# Patient Record
Sex: Female | Born: 1935
Health system: Southern US, Community
[De-identification: ages and names within clinical notes are randomized; demographics above are authoritative.]

## PROBLEM LIST (undated history)

## (undated) DIAGNOSIS — M1990 Unspecified osteoarthritis, unspecified site: Secondary | ICD-10-CM

## (undated) DIAGNOSIS — E785 Hyperlipidemia, unspecified: Secondary | ICD-10-CM

## (undated) DIAGNOSIS — D649 Anemia, unspecified: Secondary | ICD-10-CM

## (undated) DIAGNOSIS — T7840XA Allergy, unspecified, initial encounter: Secondary | ICD-10-CM

## (undated) DIAGNOSIS — I10 Essential (primary) hypertension: Secondary | ICD-10-CM

## (undated) HISTORY — DX: Anemia, unspecified: D64.9

## (undated) HISTORY — DX: Hyperlipidemia, unspecified: E78.5

## (undated) HISTORY — PX: ABDOMINAL HYSTERECTOMY: SHX81

## (undated) HISTORY — DX: Essential (primary) hypertension: I10

## (undated) HISTORY — DX: Unspecified osteoarthritis, unspecified site: M19.90

## (undated) HISTORY — DX: Allergy, unspecified, initial encounter: T78.40XA

---

## 1993-09-15 DIAGNOSIS — I10 Essential (primary) hypertension: Secondary | ICD-10-CM | POA: Insufficient documentation

## 2010-12-16 DIAGNOSIS — R42 Dizziness and giddiness: Secondary | ICD-10-CM | POA: Insufficient documentation

## 2010-12-16 DIAGNOSIS — E782 Mixed hyperlipidemia: Secondary | ICD-10-CM | POA: Insufficient documentation

## 2010-12-16 DIAGNOSIS — E559 Vitamin D deficiency, unspecified: Secondary | ICD-10-CM | POA: Insufficient documentation

## 2010-12-16 DIAGNOSIS — R7301 Impaired fasting glucose: Secondary | ICD-10-CM | POA: Insufficient documentation

## 2014-06-09 DIAGNOSIS — I10 Essential (primary) hypertension: Secondary | ICD-10-CM | POA: Diagnosis not present

## 2014-10-12 ENCOUNTER — Encounter: Payer: Self-pay | Admitting: Family Medicine

## 2014-10-12 ENCOUNTER — Ambulatory Visit (INDEPENDENT_AMBULATORY_CARE_PROVIDER_SITE_OTHER): Payer: Commercial Managed Care - HMO | Admitting: Family Medicine

## 2014-10-12 VITALS — BP 118/70 | HR 62 | Temp 98.2°F | Resp 18 | Ht 66.0 in | Wt 164.6 lb

## 2014-10-12 DIAGNOSIS — R32 Unspecified urinary incontinence: Secondary | ICD-10-CM | POA: Insufficient documentation

## 2014-10-12 DIAGNOSIS — I10 Essential (primary) hypertension: Secondary | ICD-10-CM | POA: Diagnosis not present

## 2014-10-12 DIAGNOSIS — M21161 Varus deformity, not elsewhere classified, right knee: Secondary | ICD-10-CM

## 2014-10-12 DIAGNOSIS — R42 Dizziness and giddiness: Secondary | ICD-10-CM | POA: Insufficient documentation

## 2014-10-12 DIAGNOSIS — E785 Hyperlipidemia, unspecified: Secondary | ICD-10-CM | POA: Diagnosis not present

## 2014-10-12 DIAGNOSIS — M25561 Pain in right knee: Secondary | ICD-10-CM | POA: Diagnosis not present

## 2014-10-12 DIAGNOSIS — M21169 Varus deformity, not elsewhere classified, unspecified knee: Secondary | ICD-10-CM | POA: Insufficient documentation

## 2014-10-12 DIAGNOSIS — M1990 Unspecified osteoarthritis, unspecified site: Secondary | ICD-10-CM | POA: Insufficient documentation

## 2014-10-12 DIAGNOSIS — D649 Anemia, unspecified: Secondary | ICD-10-CM | POA: Insufficient documentation

## 2014-10-12 MED ORDER — BENAZEPRIL HCL 20 MG PO TABS: 20.0000 mg | ORAL_TABLET | Freq: Every day | ORAL | Status: DC

## 2014-10-12 MED ORDER — AMLODIPINE BESYLATE 10 MG PO TABS: 10.0000 mg | ORAL_TABLET | Freq: Every day | ORAL | Status: DC

## 2014-10-12 NOTE — Progress Notes (Signed)
Name: Michelle Blackwell   MRN: [id number]    DOB: Jan 27, 1936   Date:10/12/2014       Progress Note  Subjective  Chief Complaint  Chief Complaint  Patient presents with  . Medication Refill  . Hypertension  . Hyperlipidemia  . Anemia  . Arthritis    HPI  Hypertension: Patient here for follow-up of elevated blood pressure. She is exercising and is adherent to low salt diet. Currently taking herbal supplements, amlodipine 10 mg a day and lotensin 20 mg a day. Blood pressure is well controlled at home. Cardiac symptoms none. Patient denies chest pain, chest pressure/discomfort, claudication, dyspnea, exertional chest pressure/discomfort, irregular heart beat, lower extremity edema, near-syncope, orthopnea, palpitations, paroxysmal nocturnal dyspnea and syncope.  Cardiovascular risk factors: advanced age (older than 53 for men, 22 for women), dyslipidemia and hypertension. Use of agents associated with hypertension: none. History of target organ damage: none.  Joint/Muscle Pain: Patient complains of arthralgias for which has been present for several months. Pain is located in the right knee(s), is described as aching, and is intermittent .  Associated symptoms include: edema and instability.  The patient has tried nothing for pain relief.  Related to injury:   No.  Resting usually helps. But she is very active and when working at [employer] she notes the knee pain is more prominent. She is not interested in NSAIDs at this time but would like orthopedic evaluation to discuss if a knee brace would be beneficial to her to prevent further deterioration. She has had extroverted feet for which she uses custom orthotics.     Patient Active Problem List   Diagnosis Date Noted  . Absolute anemia 10/12/2014  . Arthritis 10/12/2014  . HLD (hyperlipidemia) 10/12/2014  . BP (high blood pressure) 10/12/2014  . Head revolving around 10/12/2014  . Absence of bladder continence 10/12/2014    Social History   Substance Use Topics  . Smoking status: Never Smoker   . Smokeless tobacco: Never Used  . Alcohol Use: 0.0 oz/week    0 Standard drinks or equivalent per week     Comment: occasional      Current outpatient prescriptions:  .  amLODipine (NORVASC) 10 MG tablet, , Disp: , Rfl:  .  benazepril (LOTENSIN) 20 MG tablet, , Disp: , Rfl:  .  Biotin 1000 MCG tablet, Take by mouth., Disp: , Rfl:  .  Cinnamon 500 MG capsule, Take by mouth., Disp: , Rfl:  .  Cyanocobalamin (VITAMIN B-12 ER PO), , Disp: , Rfl:  .  Fenugreek 610 MG CAPS, Take by mouth., Disp: , Rfl:  .  GINSENG PO, Take by mouth., Disp: , Rfl:  .  Guggulipid 500 MG CAPS, Take by mouth., Disp: , Rfl:  .  Gymnema Sylvestris Leaf POWD, , Disp: , Rfl:  .  HAWTHORN PO, Take by mouth., Disp: , Rfl:  .  LECITHIN PO, , Disp: , Rfl:  .  Multiple Vitamins-Minerals (MULTIVITAMIN ADULTS 50+) TABS, Take by mouth., Disp: , Rfl:  .  Multiple Vitamins-Minerals (VISION VITAMINS PO), , Disp: , Rfl:  .  Omega-3 Fatty Acids (FISH OIL PO), , Disp: , Rfl:  .  TURMERIC PO, , Disp: , Rfl:  .  vitamin E 400 UNIT capsule, Take by mouth., Disp: , Rfl:   Past Surgical History  Procedure Laterality Date  . Abdominal hysterectomy      History reviewed. No pertinent family history.  Allergies  Allergen Reactions  . Ciprofloxacin  green beans - croup, gas & bloating  fresh mango - throat closure & facial swelling     Review of Systems  CONSTITUTIONAL: No significant weight changes, fever, chills, weakness or fatigue.  HEENT:  - Eyes: No visual changes.  - Ears: No auditory changes. No pain.  - Nose: No sneezing, congestion, runny nose. - Throat: No sore throat. No changes in swallowing. SKIN: No rash or itching.  CARDIOVASCULAR: No chest pain, chest pressure or chest discomfort. No palpitations or edema.  RESPIRATORY: No shortness of breath, cough or sputum.  GASTROINTESTINAL: No anorexia, nausea, vomiting. No changes in bowel habits. No  abdominal pain or blood.  GENITOURINARY: No dysuria. No frequency. No discharge.  NEUROLOGICAL: No headache, dizziness, syncope, paralysis, ataxia, numbness or tingling in the extremities. No memory changes. No change in bowel or bladder control.  MUSCULOSKELETAL: Yes joint pain. No muscle pain. HEMATOLOGIC: No anemia, bleeding or bruising.  LYMPHATICS: No enlarged lymph nodes.  PSYCHIATRIC: No change in mood. No change in sleep pattern.  ENDOCRINOLOGIC: No reports of sweating, cold or heat intolerance. No polyuria or polydipsia.     Objective  BP 118/70 mmHg  Pulse 62  Temp(Src) 98.2 F (36.8 C) (Oral)  Resp 18  Ht 5\' 6"  (1.676 m)  Wt 164 lb 9.6 oz (74.662 kg)  BMI 26.58 kg/m2  SpO2 97% Body mass index is 26.58 kg/(m^2).  Physical Exam  Constitutional: Patient appears well-developed and well-nourished. In no distress.  HEENT:  - Head: Normocephalic and atraumatic.  - Ears: Bilateral TMs gray, no erythema or effusion - Nose: Nasal mucosa moist - Mouth/Throat: Oropharynx is clear and moist. No tonsillar hypertrophy or erythema. No post nasal drainage.  - Eyes: Conjunctivae clear, EOM movements normal. PERRLA. No scleral icterus.  Neck: Normal range of motion. Neck supple. No JVD present. No thyromegaly present.  Cardiovascular: Normal rate, regular rhythm and normal heart sounds.  No murmur heard.  Pulmonary/Chest: Effort normal and breath sounds normal. No respiratory distress. Musculoskeletal: Normal range of motion bilateral UE and LE, no joint effusions. Right knee genu varus with mild swelling, no laxity, full ROM, mild crepitus.  Peripheral vascular: Bilateral LE no edema. Neurological: CN II-XII grossly intact with no focal deficits. Alert and oriented to person, place, and time. Coordination, balance, strength, speech and gait are normal.  Skin: Skin is warm and dry. No rash noted. No erythema.  Psychiatric: Patient has a normal mood and affect. Behavior is normal in  office today. Judgment and thought content normal in office today.   Assessment & Plan   1. Hypertension goal BP (blood pressure) < 140/90 Clinically stable findings based on clinical exam and on review of any pertinent results. Recommended to patient that they continue their current regimen with regular follow ups.  - CBC with Differential/Platelet - Comprehensive metabolic panel - TSH - amLODipine (NORVASC) 10 MG tablet; Take 1 tablet (10 mg total) by mouth daily.  Dispense: 90 tablet; Refill: 3 - benazepril (LOTENSIN) 20 MG tablet; Take 1 tablet (20 mg total) by mouth daily.  Dispense: 90 tablet; Refill: 3  2. Hyperlipidemia LDL goal <100 Blood work.  - Lipid panel - TSH  3. Right knee pain Discussed treatment options, she would prefer to NSAIDs at this time but will consider using ibuprofen prior to activity if needed.  - Ambulatory referral to Orthopedic Surgery  4. Genu varus, right  - Ambulatory referral to Orthopedic Surgery   She prefers to get her flu shot at [facility].

## 2014-10-13 LAB — LIPID PANEL
Chol/HDL Ratio: 6.7 ratio units — ABNORMAL HIGH (ref 0.0–4.4)
Cholesterol, Total: 233 mg/dL — ABNORMAL HIGH (ref 100–199)
HDL: 35 mg/dL — ABNORMAL LOW (ref >39)
LDL Calculated: 156 mg/dL — ABNORMAL HIGH (ref 0–99)
Triglycerides: 208 mg/dL — ABNORMAL HIGH (ref 0–149)
VLDL Cholesterol Cal: 42 mg/dL — ABNORMAL HIGH (ref 5–40)

## 2014-10-13 LAB — COMPREHENSIVE METABOLIC PANEL
ALT: 22 IU/L (ref 0–32)
AST: 22 IU/L (ref 0–40)
Albumin/Globulin Ratio: 1.6 (ref 1.1–2.5)
Albumin: 4.6 g/dL (ref 3.5–4.8)
Alkaline Phosphatase: 107 IU/L (ref 39–117)
BUN/Creatinine Ratio: 16 (ref 11–26)
BUN: 15 mg/dL (ref 8–27)
Bilirubin Total: 0.3 mg/dL (ref 0.0–1.2)
CO2: 26 mmol/L (ref 18–29)
Calcium: 10.3 mg/dL (ref 8.7–10.3)
Chloride: 102 mmol/L (ref 97–108)
Creatinine, Ser: 0.95 mg/dL (ref 0.57–1.00)
GFR calc Af Amer: 67 mL/min/{1.73_m2} (ref >59)
GFR calc non Af Amer: 58 mL/min/{1.73_m2} — ABNORMAL LOW (ref >59)
Globulin, Total: 2.9 g/dL (ref 1.5–4.5)
Glucose: 91 mg/dL (ref 65–99)
Potassium: 5.3 mmol/L — ABNORMAL HIGH (ref 3.5–5.2)
Sodium: 145 mmol/L — ABNORMAL HIGH (ref 134–144)
Total Protein: 7.5 g/dL (ref 6.0–8.5)

## 2014-10-13 LAB — CBC WITH DIFFERENTIAL/PLATELET
Basophils Absolute: 0.1 10*3/uL (ref 0.0–0.2)
Basos: 1 %
EOS (ABSOLUTE): 0.3 10*3/uL (ref 0.0–0.4)
Eos: 5 %
Hematocrit: 42.6 % (ref 34.0–46.6)
Hemoglobin: 14.1 g/dL (ref 11.1–15.9)
Immature Grans (Abs): 0 10*3/uL (ref 0.0–0.1)
Immature Granulocytes: 0 %
Lymphocytes Absolute: 1.6 10*3/uL (ref 0.7–3.1)
Lymphs: 26 %
MCH: 29.6 pg (ref 26.6–33.0)
MCHC: 33.1 g/dL (ref 31.5–35.7)
MCV: 90 fL (ref 79–97)
Monocytes Absolute: 0.6 10*3/uL (ref 0.1–0.9)
Monocytes: 9 %
Neutrophils Absolute: 3.5 10*3/uL (ref 1.4–7.0)
Neutrophils: 59 %
Platelets: 345 10*3/uL (ref 150–379)
RBC: 4.76 x10E6/uL (ref 3.77–5.28)
RDW: 13.6 % (ref 12.3–15.4)
WBC: 6 10*3/uL (ref 3.4–10.8)

## 2014-10-13 LAB — TSH: TSH: 3.14 u[IU]/mL (ref 0.450–4.500)

## 2014-10-19 DIAGNOSIS — M170 Bilateral primary osteoarthritis of knee: Secondary | ICD-10-CM | POA: Diagnosis not present

## 2015-01-12 DIAGNOSIS — H2513 Age-related nuclear cataract, bilateral: Secondary | ICD-10-CM | POA: Diagnosis not present

## 2015-01-12 DIAGNOSIS — H40013 Open angle with borderline findings, low risk, bilateral: Secondary | ICD-10-CM | POA: Diagnosis not present

## 2015-01-12 DIAGNOSIS — Z0100 Encounter for examination of eyes and vision without abnormal findings: Secondary | ICD-10-CM | POA: Diagnosis not present

## 2015-07-27 ENCOUNTER — Other Ambulatory Visit: Payer: Self-pay

## 2015-07-27 DIAGNOSIS — I10 Essential (primary) hypertension: Secondary | ICD-10-CM

## 2015-07-27 MED ORDER — AMLODIPINE BESYLATE 10 MG PO TABS
10.0000 mg | ORAL_TABLET | Freq: Every day | ORAL | Status: DC
Start: 2015-07-27 — End: 2015-10-31

## 2015-07-27 MED ORDER — BENAZEPRIL HCL 20 MG PO TABS: 20.0000 mg | ORAL_TABLET | Freq: Every day | ORAL | Status: DC

## 2015-07-27 NOTE — Telephone Encounter (Signed)
Pt will schedule an appt at a later date.

## 2015-07-27 NOTE — Telephone Encounter (Signed)
Please ask pt to schedule an appt and we'll get fasting labs too; I'd like to recheck her potassium before I approve a 90 day supply; we might need to change her medicine; thank you

## 2015-10-27 ENCOUNTER — Ambulatory Visit: Payer: Commercial Managed Care - HMO | Admitting: Family Medicine

## 2015-10-31 ENCOUNTER — Ambulatory Visit (INDEPENDENT_AMBULATORY_CARE_PROVIDER_SITE_OTHER): Payer: Commercial Managed Care - HMO | Admitting: Family Medicine

## 2015-10-31 ENCOUNTER — Other Ambulatory Visit: Payer: Self-pay | Admitting: Family Medicine

## 2015-10-31 ENCOUNTER — Encounter: Payer: Self-pay | Admitting: Family Medicine

## 2015-10-31 VITALS — BP 136/74 | HR 64 | Temp 97.5°F | Resp 14 | Wt 167.0 lb

## 2015-10-31 DIAGNOSIS — E785 Hyperlipidemia, unspecified: Secondary | ICD-10-CM

## 2015-10-31 DIAGNOSIS — I10 Essential (primary) hypertension: Secondary | ICD-10-CM | POA: Diagnosis not present

## 2015-10-31 DIAGNOSIS — Z5181 Encounter for therapeutic drug level monitoring: Secondary | ICD-10-CM

## 2015-10-31 DIAGNOSIS — Z23 Encounter for immunization: Secondary | ICD-10-CM

## 2015-10-31 DIAGNOSIS — E875 Hyperkalemia: Secondary | ICD-10-CM

## 2015-10-31 DIAGNOSIS — M1990 Unspecified osteoarthritis, unspecified site: Secondary | ICD-10-CM

## 2015-10-31 LAB — COMPLETE METABOLIC PANEL WITH GFR
ALT: 18 U/L (ref 6–29)
AST: 19 U/L (ref 10–35)
Albumin: 4.5 g/dL (ref 3.6–5.1)
Alkaline Phosphatase: 79 U/L (ref 33–130)
BUN: 21 mg/dL (ref 7–25)
CO2: 26 mmol/L (ref 20–31)
Calcium: 9.8 mg/dL (ref 8.6–10.4)
Chloride: 104 mmol/L (ref 98–110)
Creat: 0.91 mg/dL (ref 0.60–0.93)
GFR, Est African American: 70 mL/min (ref >=60)
GFR, Est Non African American: 61 mL/min (ref >=60)
Glucose, Bld: 90 mg/dL (ref 65–99)
Potassium: 4.5 mmol/L (ref 3.5–5.3)
Sodium: 140 mmol/L (ref 135–146)
Total Bilirubin: 0.5 mg/dL (ref 0.2–1.2)
Total Protein: 7.4 g/dL (ref 6.1–8.1)

## 2015-10-31 LAB — LIPID PANEL
Cholesterol: 251 mg/dL — ABNORMAL HIGH (ref 125–200)
HDL: 32 mg/dL — ABNORMAL LOW (ref >=46)
LDL Cholesterol: 163 mg/dL — ABNORMAL HIGH (ref <130)
Total CHOL/HDL Ratio: 7.8 Ratio — ABNORMAL HIGH (ref <=5.0)
Triglycerides: 278 mg/dL — ABNORMAL HIGH (ref <150)
VLDL: 56 mg/dL — ABNORMAL HIGH (ref <30)

## 2015-10-31 MED ORDER — BENAZEPRIL HCL 20 MG PO TABS: 20.0000 mg | ORAL_TABLET | Freq: Every day | ORAL | 2 refills | Status: DC

## 2015-10-31 MED ORDER — AMLODIPINE BESYLATE 10 MG PO TABS
10.0000 mg | ORAL_TABLET | Freq: Every day | ORAL | 0 refills | Status: DC
Start: 2015-10-31 — End: 2016-01-04

## 2015-10-31 MED ORDER — BENAZEPRIL HCL 20 MG PO TABS: 20.0000 mg | ORAL_TABLET | Freq: Every day | ORAL | 0 refills | Status: DC

## 2015-10-31 NOTE — Assessment & Plan Note (Signed)
Mild hyperkalemia noted on last year's labs; she is on an ACE-I; check level today and adjust med and recheck if needed

## 2015-10-31 NOTE — Progress Notes (Signed)
BP 136/74   Pulse 64   Temp 97.5 F (36.4 C) (Oral)   Resp 14   Wt 167 lb (75.8 kg)   SpO2 95%   BMI 26.95 kg/m    Subjective:    Patient ID: Michelle Blackwell, female    DOB: 1935-05-15, 80 y.o.   MRN: [id number]  HPI: Michelle Blackwell is a 80 y.o. female  Chief Complaint  Patient presents with  . Medication Refill   Patient is new to me; her previous provider left our practice She is an Administrator She says the allergies are just green beans, mango, and cipro HTN; well-controlled currently; checks her BP away from the doctor; BP can go up and down; cannot do push mower any more, mowed for 20 minute and BP jumped 30 points, up to 152 after mowing; took hawthorne to help lower BP; her medicine doesn't last 24 hours; she avoids salt, goes out to eat occasionally; does not cook with salt; avoid decongestants High cholesterol; not on medicine; her cholesterol has not been controlled well she says; eating eggs all the time, removes the yolks sometimes; some cheese, loves cheese, low sodium chips for nachos Arthritis; hands, knees, hips, psoriatic arthritis (no skin lesions any more); no pitting on nails; no pain; takes hyaluronic acid; glucosamine does not work for her Anemia; last CBC Sept 2016 showed H/H was completely normal with normal MCV and normal MCH; anemia was >20 years ago she says around menopause  Depression screen [facility] 2/9 10/31/2015 10/12/2014  Decreased Interest 0 0  Down, Depressed, Hopeless 0 0  PHQ - 2 Score 0 0   Relevant past medical, surgical, family and social history reviewed Past Medical History:  Diagnosis Date  . Allergy   . Anemia   . Arthritis   . Hyperlipidemia   . Hypertension   no diabetes, no heart disease  Past Surgical History:  Procedure Laterality Date  . ABDOMINAL HYSTERECTOMY     Family History  Problem Relation Age of Onset  . Cancer Mother     lung cancer; heavy smoker for years  . Heart disease Mother     congestive heart failure  .  Heart disease Brother     not sure, may have had sudden cardiac death     Social History  Substance Use Topics  . Smoking status: Never Smoker  . Smokeless tobacco: Never Used  . Alcohol use 0.0 oz/week     Comment: occasional    Interim medical history since last visit reviewed. Allergies and medications reviewed  Review of Systems Per HPI unless specifically indicated above     Objective:    BP 136/74   Pulse 64   Temp 97.5 F (36.4 C) (Oral)   Resp 14   Wt 167 lb (75.8 kg)   SpO2 95%   BMI 26.95 kg/m   Wt Readings from Last 3 Encounters:  10/31/15 167 lb (75.8 kg)  10/12/14 164 lb 9.6 oz (74.7 kg)    Physical Exam  Constitutional: She appears well-developed and well-nourished. No distress.  HENT:  Head: Normocephalic and atraumatic.  Eyes: EOM are normal. No scleral icterus.  Neck: No thyromegaly present.  Cardiovascular: Normal rate, regular rhythm and normal heart sounds.   No murmur heard. Pulmonary/Chest: Effort normal and breath sounds normal. No respiratory distress. She has no wheezes.  Abdominal: Soft. Bowel sounds are normal. She exhibits no distension.  Musculoskeletal: Normal range of motion. She exhibits edema (trace nonpitting edema, trace line from sock  elastic).  heberdens and bouchards nodes both hands  Neurological: She is alert. She exhibits normal muscle tone.  Skin: Skin is warm and dry. She is not diaphoretic. No pallor.  Psychiatric: She has a normal mood and affect. Her behavior is normal. Judgment and thought content normal. Her mood appears not anxious. She does not exhibit a depressed mood.  Talkative but redirectable   Results for orders placed or performed in visit on 10/12/14  CBC with Differential/Platelet  Result Value Ref Range   WBC 6.0 3.4 - 10.8 x10E3/uL   RBC 4.76 3.77 - 5.28 x10E6/uL   Hemoglobin 14.1 11.1 - 15.9 g/dL   Hematocrit 42.6 34.0 - 46.6 %   MCV 90 79 - 97 fL   MCH 29.6 26.6 - 33.0 pg   MCHC 33.1 31.5 - 35.7  g/dL   RDW 13.6 12.3 - 15.4 %   Platelets 345 150 - 379 x10E3/uL   Neutrophils 59 %   Lymphs 26 %   Monocytes 9 %   Eos 5 %   Basos 1 %   Neutrophils Absolute 3.5 1.4 - 7.0 x10E3/uL   Lymphocytes Absolute 1.6 0.7 - 3.1 x10E3/uL   Monocytes Absolute 0.6 0.1 - 0.9 x10E3/uL   EOS (ABSOLUTE) 0.3 0.0 - 0.4 x10E3/uL   Basophils Absolute 0.1 0.0 - 0.2 x10E3/uL   Immature Granulocytes 0 %   Immature Grans (Abs) 0.0 0.0 - 0.1 x10E3/uL  Comprehensive metabolic panel  Result Value Ref Range   Glucose 91 65 - 99 mg/dL   BUN 15 8 - 27 mg/dL   Creatinine, Ser 0.95 0.57 - 1.00 mg/dL   GFR calc non Af Amer 58 (L) >59 mL/min/1.73   GFR calc Af Amer 67 >59 mL/min/1.73   BUN/Creatinine Ratio 16 11 - 26   Sodium 145 (H) 134 - 144 mmol/L   Potassium 5.3 (H) 3.5 - 5.2 mmol/L   Chloride 102 97 - 108 mmol/L   CO2 26 18 - 29 mmol/L   Calcium 10.3 8.7 - 10.3 mg/dL   Total Protein 7.5 6.0 - 8.5 g/dL   Albumin 4.6 3.5 - 4.8 g/dL   Globulin, Total 2.9 1.5 - 4.5 g/dL   Albumin/Globulin Ratio 1.6 1.1 - 2.5   Bilirubin Total 0.3 0.0 - 1.2 mg/dL   Alkaline Phosphatase 107 39 - 117 IU/L   AST 22 0 - 40 IU/L   ALT 22 0 - 32 IU/L  Lipid panel  Result Value Ref Range   Cholesterol, Total 233 (H) 100 - 199 mg/dL   Triglycerides 208 (H) 0 - 149 mg/dL   HDL 35 (L) >39 mg/dL   VLDL Cholesterol Cal 42 (H) 5 - 40 mg/dL   LDL Calculated 156 (H) 0 - 99 mg/dL   Chol/HDL Ratio 6.7 (H) 0.0 - 4.4 ratio units  TSH  Result Value Ref Range   TSH 3.140 0.450 - 4.500 uIU/mL      Assessment & Plan:   Problem List Items Addressed This Visit      Cardiovascular and Mediastinum   Hypertension goal BP (blood pressure) < 140/90 - Primary (Chronic)    Continue meds; DASH guidelines        Musculoskeletal and Integument   Arthritis (Chronic)    Continue herbal remedies        Other   Hyperlipidemia LDL goal <100 (Chronic)    Check lipids      Relevant Orders   Lipid panel   Hyperkalemia    Mild  hyperkalemia noted on last year's labs; she is on an ACE-I; check level today and adjust med and recheck if needed      Encounter for medication monitoring    Check labs      Relevant Orders   COMPLETE METABOLIC PANEL WITH GFR    Other Visit Diagnoses    Needs flu shot       Relevant Orders   Flu vaccine HIGH DOSE PF (Fluzone High dose) (Completed)      Follow up plan: Return in about 6 months (around 04/30/2016) for hypertension, high cholesterol.  An after-visit summary was printed and given to the patient at [facility].  Please see the patient instructions which may contain other information and recommendations beyond what is mentioned above in the assessment and plan.  No orders of the defined types were placed in this encounter.   Orders Placed This Encounter  Procedures  . Flu vaccine HIGH DOSE PF (Fluzone High dose)  . Lipid panel  . COMPLETE METABOLIC PANEL WITH GFR

## 2015-10-31 NOTE — Progress Notes (Signed)
90 d supply to pharmacy

## 2015-10-31 NOTE — Assessment & Plan Note (Signed)
Continue meds; DASH guidelines

## 2015-10-31 NOTE — Assessment & Plan Note (Signed)
Check labs 

## 2015-10-31 NOTE — Patient Instructions (Addendum)
Your goal blood pressure is less than 150 mmHg on top. Try to follow the DASH guidelines (DASH stands for Dietary Approaches to Stop Hypertension) Try to limit the sodium in your diet.  Ideally, consume less than 1.5 grams (less than 1,500mg ) per day. Do not add salt when cooking or at the table.  Check the sodium amount on labels when shopping, and choose items lower in sodium when given a choice. Avoid or limit foods that already contain a lot of sodium. Eat a diet rich in fruits and vegetables and whole grains.  Try to limit saturated fats in your diet (bologna, hot dogs, barbeque, cheeseburgers, hamburgers, steak, bacon, sausage, cheese, etc.) and get more fresh fruits, vegetables, and whole grains   DASH Eating Plan DASH stands for "Dietary Approaches to Stop Hypertension." The DASH eating plan is a healthy eating plan that has been shown to reduce high blood pressure (hypertension). Additional health benefits may include reducing the risk of type 2 diabetes mellitus, heart disease, and stroke. The DASH eating plan may also help with weight loss. WHAT DO I NEED TO KNOW ABOUT THE DASH EATING PLAN? For the DASH eating plan, you will follow these general guidelines:  Choose foods with a percent daily value for sodium of less than 5% (as listed on the food label).  Use salt-free seasonings or herbs instead of table salt or sea salt.  Check with your health care provider or pharmacist before using salt substitutes.  Eat lower-sodium products, often labeled as "lower sodium" or "no salt added."  Eat fresh foods.  Eat more vegetables, fruits, and low-fat dairy products.  Choose whole grains. Look for the word "whole" as the first word in the ingredient list.  Choose fish and skinless chicken or [place] more often than red meat. Limit fish, poultry, and meat to 6 oz (170 g) each day.  Limit sweets, desserts, sugars, and sugary drinks.  Choose heart-healthy fats.  Limit cheese to 1 oz (28  g) per day.  Eat more home-cooked food and less restaurant, buffet, and fast food.  Limit fried foods.  Cook foods using methods other than frying.  Limit canned vegetables. If you do use them, rinse them well to decrease the sodium.  When eating at a restaurant, ask that your food be prepared with less salt, or no salt if possible. WHAT FOODS CAN I EAT? Seek help from a dietitian for individual calorie needs. Grains Whole grain or whole wheat bread. Brown rice. Whole grain or whole wheat pasta. Quinoa, bulgur, and whole grain cereals. Low-sodium cereals. Corn or whole wheat flour tortillas. Whole grain cornbread. Whole grain crackers. Low-sodium crackers. Vegetables Fresh or frozen vegetables (raw, steamed, roasted, or grilled). Low-sodium or reduced-sodium tomato and vegetable juices. Low-sodium or reduced-sodium tomato sauce and paste. Low-sodium or reduced-sodium canned vegetables.  Fruits All fresh, canned (in natural juice), or frozen fruits. Meat and Other Protein Products Ground beef (85% or leaner), grass-fed beef, or beef trimmed of fat. Skinless chicken or [place]. Ground chicken or [place]. Pork trimmed of fat. All fish and seafood. Eggs. Dried beans, peas, or lentils. Unsalted nuts and seeds. Unsalted canned beans. Dairy Low-fat dairy products, such as skim or 1% milk, 2% or reduced-fat cheeses, low-fat ricotta or cottage cheese, or plain low-fat yogurt. Low-sodium or reduced-sodium cheeses. Fats and Oils Tub margarines without trans fats. Light or reduced-fat mayonnaise and salad dressings (reduced sodium). Avocado. Safflower, olive, or canola oils. Natural peanut or almond butter. Other Unsalted popcorn and pretzels.  The items listed above may not be a complete list of recommended foods or beverages. Contact your dietitian for more options. WHAT FOODS ARE NOT RECOMMENDED? Grains White bread. White pasta. White rice. Refined cornbread. Bagels and croissants. Crackers that  contain trans fat. Vegetables Creamed or fried vegetables. Vegetables in a cheese sauce. Regular canned vegetables. Regular canned tomato sauce and paste. Regular tomato and vegetable juices. Fruits Dried fruits. Canned fruit in light or heavy syrup. Fruit juice. Meat and Other Protein Products Fatty cuts of meat. Ribs, chicken wings, bacon, sausage, bologna, salami, chitterlings, fatback, hot dogs, bratwurst, and packaged luncheon meats. Salted nuts and seeds. Canned beans with salt. Dairy Whole or 2% milk, cream, half-and-half, and cream cheese. Whole-fat or sweetened yogurt. Full-fat cheeses or blue cheese. Nondairy creamers and whipped toppings. Processed cheese, cheese spreads, or cheese curds. Condiments Onion and garlic salt, seasoned salt, table salt, and sea salt. Canned and packaged gravies. Worcestershire sauce. Tartar sauce. Barbecue sauce. Teriyaki sauce. Soy sauce, including reduced sodium. Steak sauce. Fish sauce. Oyster sauce. Cocktail sauce. Horseradish. Ketchup and mustard. Meat flavorings and tenderizers. Bouillon cubes. Hot sauce. Tabasco sauce. Marinades. Taco seasonings. Relishes. Fats and Oils Butter, stick margarine, lard, shortening, ghee, and bacon fat. Coconut, palm kernel, or palm oils. Regular salad dressings. Other Pickles and olives. Salted popcorn and pretzels. The items listed above may not be a complete list of foods and beverages to avoid. Contact your dietitian for more information. WHERE CAN I FIND MORE INFORMATION? National Heart, Lung, and Blood Institute: travelstabloid.com   This information is not intended to replace advice given to you by your health care provider. Make sure you discuss any questions you have with your health care provider.   Document Released: 01/04/2011 Document Revised: 02/05/2014 Document Reviewed: 11/19/2012 Elsevier Interactive Patient Education [employer].

## 2015-10-31 NOTE — Assessment & Plan Note (Signed)
Continue herbal remedies

## 2015-10-31 NOTE — Assessment & Plan Note (Signed)
Check lipids 

## 2015-11-01 ENCOUNTER — Encounter: Payer: Self-pay | Admitting: Family Medicine

## 2016-01-04 ENCOUNTER — Other Ambulatory Visit: Payer: Self-pay | Admitting: Family Medicine

## 2016-01-04 DIAGNOSIS — I10 Essential (primary) hypertension: Secondary | ICD-10-CM

## 2016-01-05 DIAGNOSIS — H401130 Primary open-angle glaucoma, bilateral, stage unspecified: Secondary | ICD-10-CM | POA: Diagnosis not present

## 2016-01-05 DIAGNOSIS — H2513 Age-related nuclear cataract, bilateral: Secondary | ICD-10-CM | POA: Diagnosis not present

## 2016-01-06 DIAGNOSIS — Z0100 Encounter for examination of eyes and vision without abnormal findings: Secondary | ICD-10-CM | POA: Diagnosis not present

## 2016-01-07 NOTE — Telephone Encounter (Signed)
Last cmp reviewed; one year of refills of both rxs sent

## 2016-01-10 DIAGNOSIS — M545 Low back pain: Secondary | ICD-10-CM | POA: Diagnosis not present

## 2016-01-10 DIAGNOSIS — M170 Bilateral primary osteoarthritis of knee: Secondary | ICD-10-CM | POA: Diagnosis not present

## 2016-01-25 ENCOUNTER — Encounter: Payer: Self-pay | Admitting: Family Medicine

## 2016-02-20 DIAGNOSIS — M545 Low back pain: Secondary | ICD-10-CM | POA: Diagnosis not present

## 2016-02-20 DIAGNOSIS — M25561 Pain in right knee: Secondary | ICD-10-CM | POA: Diagnosis not present

## 2016-02-20 DIAGNOSIS — M25562 Pain in left knee: Secondary | ICD-10-CM | POA: Diagnosis not present

## 2016-02-24 DIAGNOSIS — M545 Low back pain: Secondary | ICD-10-CM | POA: Diagnosis not present

## 2016-02-24 DIAGNOSIS — M25562 Pain in left knee: Secondary | ICD-10-CM | POA: Diagnosis not present

## 2016-02-24 DIAGNOSIS — M25561 Pain in right knee: Secondary | ICD-10-CM | POA: Diagnosis not present

## 2016-02-29 DIAGNOSIS — M25562 Pain in left knee: Secondary | ICD-10-CM | POA: Diagnosis not present

## 2016-02-29 DIAGNOSIS — M25561 Pain in right knee: Secondary | ICD-10-CM | POA: Diagnosis not present

## 2016-02-29 DIAGNOSIS — M545 Low back pain: Secondary | ICD-10-CM | POA: Diagnosis not present

## 2016-03-02 DIAGNOSIS — M25562 Pain in left knee: Secondary | ICD-10-CM | POA: Diagnosis not present

## 2016-03-02 DIAGNOSIS — M25561 Pain in right knee: Secondary | ICD-10-CM | POA: Diagnosis not present

## 2016-03-02 DIAGNOSIS — M545 Low back pain: Secondary | ICD-10-CM | POA: Diagnosis not present

## 2016-03-07 DIAGNOSIS — M545 Low back pain: Secondary | ICD-10-CM | POA: Diagnosis not present

## 2016-03-07 DIAGNOSIS — M25562 Pain in left knee: Secondary | ICD-10-CM | POA: Diagnosis not present

## 2016-03-07 DIAGNOSIS — M25561 Pain in right knee: Secondary | ICD-10-CM | POA: Diagnosis not present

## 2016-03-23 DIAGNOSIS — M25561 Pain in right knee: Secondary | ICD-10-CM | POA: Diagnosis not present

## 2016-03-23 DIAGNOSIS — M545 Low back pain: Secondary | ICD-10-CM | POA: Diagnosis not present

## 2016-03-23 DIAGNOSIS — M25562 Pain in left knee: Secondary | ICD-10-CM | POA: Diagnosis not present

## 2016-03-26 ENCOUNTER — Other Ambulatory Visit: Payer: Self-pay

## 2016-03-26 DIAGNOSIS — E785 Hyperlipidemia, unspecified: Secondary | ICD-10-CM | POA: Diagnosis not present

## 2016-03-26 DIAGNOSIS — Z5181 Encounter for therapeutic drug level monitoring: Secondary | ICD-10-CM | POA: Diagnosis not present

## 2016-03-28 ENCOUNTER — Ambulatory Visit (INDEPENDENT_AMBULATORY_CARE_PROVIDER_SITE_OTHER): Payer: Medicare HMO | Admitting: Family Medicine

## 2016-03-28 ENCOUNTER — Encounter: Payer: Self-pay | Admitting: Family Medicine

## 2016-03-28 VITALS — BP 118/74 | HR 82 | Temp 97.8°F | Resp 14 | Wt 165.0 lb

## 2016-03-28 DIAGNOSIS — J0110 Acute frontal sinusitis, unspecified: Secondary | ICD-10-CM | POA: Diagnosis not present

## 2016-03-28 LAB — COMPLETE METABOLIC PANEL WITH GFR
ALT: 16 U/L (ref 6–29)
AST: 21 U/L (ref 10–35)
Albumin: 4.5 g/dL (ref 3.6–5.1)
Alkaline Phosphatase: 87 U/L (ref 33–130)
BUN: 20 mg/dL (ref 7–25)
CO2: 29 mmol/L (ref 20–31)
Calcium: 10 mg/dL (ref 8.6–10.4)
Chloride: 102 mmol/L (ref 98–110)
Creat: 0.96 mg/dL — ABNORMAL HIGH (ref 0.60–0.93)
GFR, Est African American: 66 mL/min (ref >=60)
GFR, Est Non African American: 57 mL/min — ABNORMAL LOW (ref >=60)
Glucose, Bld: 91 mg/dL (ref 65–99)
Potassium: 5.1 mmol/L (ref 3.5–5.3)
Sodium: 139 mmol/L (ref 135–146)
Total Bilirubin: 0.5 mg/dL (ref 0.2–1.2)
Total Protein: 8 g/dL (ref 6.1–8.1)

## 2016-03-28 LAB — LIPID PANEL
Cholesterol: 247 mg/dL — ABNORMAL HIGH (ref <200)
HDL: 30 mg/dL — ABNORMAL LOW (ref >50)
LDL Cholesterol: 153 mg/dL — ABNORMAL HIGH (ref <100)
Total CHOL/HDL Ratio: 8.2 Ratio — ABNORMAL HIGH (ref <5.0)
Triglycerides: 322 mg/dL — ABNORMAL HIGH (ref <150)
VLDL: 64 mg/dL — ABNORMAL HIGH (ref <30)

## 2016-03-28 MED ORDER — AMOXICILLIN-POT CLAVULANATE 875-125 MG PO TABS: 1.0000 | ORAL_TABLET | Freq: Two times a day (BID) | ORAL | 0 refills | Status: DC

## 2016-03-28 NOTE — Patient Instructions (Signed)
Rest and hydration You are welcome to wait another day or two to see if you get over this on your own without antibiotics If you start the antibiotic... Please do eat yogurt daily or take a probiotic daily for the next month or two We want to replace the healthy germs in the gut If you notice foul, watery diarrhea in the next two months, schedule an appointment RIGHT AWAY

## 2016-03-28 NOTE — Progress Notes (Signed)
BP 118/74   Pulse 82   Temp 97.8 F (36.6 C) (Oral)   Resp 14   Wt 165 lb (74.8 kg)   SpO2 94%   BMI 26.63 kg/m    Subjective:    Patient ID: Michelle Blackwell, female    DOB: April 11, 1935, 81 y.o.   MRN: [id number]  HPI: Michelle Blackwell is a 81 y.o. female  Chief Complaint  Patient presents with  . URI   Patient is here for an acute visit She started to feel symptoms on February 13th Her granddaughter had a 102 degree fever and stayed with her while she was sick; no aches and pains; coughing; then took her home and daughter had fever too; then visitors came in for family get-together Then patient started to get symptoms; took things to keep from getting sick No aches or pain No diarrhea or vomiting; strictly a head colde; still hoarse throat Thinks she has a sinus infection now; constant stuffiness and draining; sinus headache Makes feverfew salve and it helped the sinus headache Headaches over the forehead; one side will drain Green drainage Has had sinus infections before and this feels like it Has pressure in her face and cheek; also right lymph node under the jaw  Depression screen [facility] 2/9 03/28/2016 10/31/2015 10/12/2014  Decreased Interest 0 0 0  Down, Depressed, Hopeless 0 0 0  PHQ - 2 Score 0 0 0   Relevant past medical, surgical, family and social history reviewed Past Medical History:  Diagnosis Date  . Allergy   . Anemia   . Arthritis   . Hyperlipidemia   . Hypertension    Past Surgical History:  Procedure Laterality Date  . ABDOMINAL HYSTERECTOMY     Social History  Substance Use Topics  . Smoking status: Never Smoker  . Smokeless tobacco: Never Used  . Alcohol use 0.0 oz/week     Comment: occasional    Interim medical history since last visit reviewed. Allergies and medications reviewed  Review of Systems Per HPI unless specifically indicated above     Objective:    BP 118/74   Pulse 82   Temp 97.8 F (36.6 C) (Oral)   Resp 14   Wt 165 lb  (74.8 kg)   SpO2 94%   BMI 26.63 kg/m   Wt Readings from Last 3 Encounters:  03/28/16 165 lb (74.8 kg)  10/31/15 167 lb (75.8 kg)  10/12/14 164 lb 9.6 oz (74.7 kg)    Physical Exam  Constitutional: She appears well-developed and well-nourished. No distress.  HENT:  Right Ear: Hearing, tympanic membrane, external ear and ear canal normal. No drainage. Tympanic membrane is not erythematous. No middle ear effusion.  Left Ear: Hearing, tympanic membrane, external ear and ear canal normal. No drainage. Tympanic membrane is not erythematous.  No middle ear effusion.  Nose: Mucosal edema and rhinorrhea present.  Mouth/Throat: Oropharynx is clear and moist and mucous membranes are normal.  Eyes: EOM are normal. No scleral icterus.  Neck: No thyromegaly present.  Cardiovascular: Normal rate and regular rhythm.   Pulmonary/Chest: Effort normal and breath sounds normal.  Lymphadenopathy:    She has no cervical adenopathy.  Skin: No pallor.  Psychiatric: She has a normal mood and affect.      Assessment & Plan:   Problem List Items Addressed This Visit    None    Visit Diagnoses    Acute non-recurrent frontal sinusitis    -  Primary   suspect this followed URI;  sick now >10 days; offered ABX, but cautioned about C diff risk; symptomatic care   Relevant Medications   amoxicillin-clavulanate (AUGMENTIN) 875-125 MG tablet       Follow up plan: Return if symptoms worsen or fail to improve.  An after-visit summary was printed and given to the patient at [place].  Please see the patient instructions which may contain other information and recommendations beyond what is mentioned above in the assessment and plan.  Meds ordered this encounter  Medications  . Red Yeast Rice 500 MG/0.5GM POWD    Sig: Take by mouth.  . cholecalciferol (VITAMIN D) 1000 units tablet    Sig: Take 1,000 Units by mouth daily.  . Ascorbic Acid (VITAMIN C) 100 MG tablet    Sig: Take 100 mg by mouth daily.  Marland Kitchen  Hyaluronic Acid 20-60 MG CAPS    Sig: Take by mouth.  Marland Kitchen amoxicillin-clavulanate (AUGMENTIN) 875-125 MG tablet    Sig: Take 1 tablet by mouth 2 (two) times daily.    Dispense:  20 tablet    Refill:  0

## 2016-03-29 DIAGNOSIS — H40013 Open angle with borderline findings, low risk, bilateral: Secondary | ICD-10-CM | POA: Diagnosis not present

## 2016-03-29 DIAGNOSIS — D3132 Benign neoplasm of left choroid: Secondary | ICD-10-CM | POA: Diagnosis not present

## 2016-03-29 DIAGNOSIS — H2513 Age-related nuclear cataract, bilateral: Secondary | ICD-10-CM | POA: Diagnosis not present

## 2016-03-29 DIAGNOSIS — H25013 Cortical age-related cataract, bilateral: Secondary | ICD-10-CM | POA: Diagnosis not present

## 2016-03-29 DIAGNOSIS — H2512 Age-related nuclear cataract, left eye: Secondary | ICD-10-CM | POA: Diagnosis not present

## 2016-03-30 ENCOUNTER — Ambulatory Visit: Payer: Commercial Managed Care - HMO | Admitting: Family Medicine

## 2016-04-06 DIAGNOSIS — M25561 Pain in right knee: Secondary | ICD-10-CM | POA: Diagnosis not present

## 2016-04-06 DIAGNOSIS — M25562 Pain in left knee: Secondary | ICD-10-CM | POA: Diagnosis not present

## 2016-04-06 DIAGNOSIS — M545 Low back pain: Secondary | ICD-10-CM | POA: Diagnosis not present

## 2016-04-19 DIAGNOSIS — M545 Low back pain: Secondary | ICD-10-CM | POA: Diagnosis not present

## 2016-04-19 DIAGNOSIS — M25561 Pain in right knee: Secondary | ICD-10-CM | POA: Diagnosis not present

## 2016-04-19 DIAGNOSIS — M25562 Pain in left knee: Secondary | ICD-10-CM | POA: Diagnosis not present

## 2016-04-25 DIAGNOSIS — M25561 Pain in right knee: Secondary | ICD-10-CM | POA: Diagnosis not present

## 2016-04-25 DIAGNOSIS — M25562 Pain in left knee: Secondary | ICD-10-CM | POA: Diagnosis not present

## 2016-04-25 DIAGNOSIS — M545 Low back pain: Secondary | ICD-10-CM | POA: Diagnosis not present

## 2016-04-29 HISTORY — PX: CATARACT EXTRACTION: SUR2

## 2016-05-01 DIAGNOSIS — H25812 Combined forms of age-related cataract, left eye: Secondary | ICD-10-CM | POA: Diagnosis not present

## 2016-05-01 DIAGNOSIS — H2512 Age-related nuclear cataract, left eye: Secondary | ICD-10-CM | POA: Diagnosis not present

## 2016-05-21 DIAGNOSIS — H25011 Cortical age-related cataract, right eye: Secondary | ICD-10-CM | POA: Diagnosis not present

## 2016-05-21 DIAGNOSIS — H2511 Age-related nuclear cataract, right eye: Secondary | ICD-10-CM | POA: Diagnosis not present

## 2016-10-30 ENCOUNTER — Telehealth: Payer: Self-pay | Admitting: Family Medicine

## 2016-10-30 NOTE — Telephone Encounter (Signed)
appt please Thank you

## 2016-10-30 NOTE — Telephone Encounter (Signed)
COMING IN ON OCT 9TH

## 2016-10-30 NOTE — Telephone Encounter (Signed)
PT SAID SHE THINKS THAT THE DR TOLD HER NOT TO GET PNEUMONIA SHOT AWHILE BACK AND JUST WANTS TO KNOW SHOULD SHE GET THE SHOT NOW.  PATIENT IS ASKING FOR A HANDICAP PAPER TO BE FILLED OUT DUE TO HER KNEE ON R SIDE IS BOTHERING HER. TOLD HER SHE WOULD BRING THE PAPER IN THAT WE WILL GIVE TO THE DR AND GET IT FILLED OUT IF IS OKAYED.

## 2016-11-06 ENCOUNTER — Ambulatory Visit (INDEPENDENT_AMBULATORY_CARE_PROVIDER_SITE_OTHER): Payer: Medicare HMO | Admitting: Family Medicine

## 2016-11-06 ENCOUNTER — Encounter: Payer: Self-pay | Admitting: Family Medicine

## 2016-11-06 VITALS — BP 124/58 | HR 67 | Temp 97.9°F | Resp 14 | Wt 170.9 lb

## 2016-11-06 DIAGNOSIS — Z23 Encounter for immunization: Secondary | ICD-10-CM | POA: Insufficient documentation

## 2016-11-06 DIAGNOSIS — M21161 Varus deformity, not elsewhere classified, right knee: Secondary | ICD-10-CM | POA: Diagnosis not present

## 2016-11-06 DIAGNOSIS — E785 Hyperlipidemia, unspecified: Secondary | ICD-10-CM

## 2016-11-06 DIAGNOSIS — I10 Essential (primary) hypertension: Secondary | ICD-10-CM | POA: Diagnosis not present

## 2016-11-06 DIAGNOSIS — Z5181 Encounter for therapeutic drug level monitoring: Secondary | ICD-10-CM

## 2016-11-06 NOTE — Assessment & Plan Note (Signed)
Right knee, chronic problem

## 2016-11-06 NOTE — Assessment & Plan Note (Signed)
Discussed new goal per [facility] vs older goal still approved by AAFP

## 2016-11-06 NOTE — Assessment & Plan Note (Signed)
Patient does want a statin; offered other medicines; she wants to check and get a new baseline

## 2016-11-06 NOTE — Patient Instructions (Signed)
Return in the next week or so for labs (fasting) Okay for supplements, medicine, water, black coffee but nothing with calories  Try to follow the DASH guidelines (DASH stands for Dietary Approaches to Stop Hypertension) Try to limit the sodium in your diet.  Ideally, consume less than 1.5 grams (less than 1,500mg ) per day. Do not add salt when cooking or at the table.  Check the sodium amount on labels when shopping, and choose items lower in sodium when given a choice. Avoid or limit foods that already contain a lot of sodium. Eat a diet rich in fruits and vegetables and whole grains.  I'll suggest a Medicare wellness visit, covered by insurance

## 2016-11-06 NOTE — Assessment & Plan Note (Signed)
Offered, given today

## 2016-11-06 NOTE — Assessment & Plan Note (Signed)
Check labs 

## 2016-11-06 NOTE — Assessment & Plan Note (Signed)
Offered, counseling given, vaccine given today

## 2016-11-06 NOTE — Assessment & Plan Note (Signed)
Patient declined referral back to orthopaedist; not interested in injections at this time; will continue ice; parking placard application filled out; she will continue hyaluronic acid; using turmeric
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
## 2016-12-04 ENCOUNTER — Telehealth: Payer: Self-pay

## 2016-12-04 NOTE — Telephone Encounter (Signed)
I have not called her I do see that she is due for labs, though

## 2016-12-04 NOTE — Telephone Encounter (Signed)
Copied from [place] [id number]. Topic: Inquiry >> Dec 03, 2016  5:37 PM Neva Seat wrote: Pt called Dr. Sanda Klein back.  Says she had 2 calls from Dr. Sanda Klein.  No voicemail was left.  Patient states that she is returning your phone call? Please advise. Thanks!

## 2016-12-04 NOTE — Telephone Encounter (Signed)
Called pt, no answer. LM informing pt that no one from our office called her. Did inform pt that she was due for lab work.

## 2016-12-05 ENCOUNTER — Encounter: Payer: Self-pay | Admitting: Family Medicine

## 2016-12-14 DIAGNOSIS — H2511 Age-related nuclear cataract, right eye: Secondary | ICD-10-CM | POA: Diagnosis not present

## 2016-12-14 DIAGNOSIS — H40021 Open angle with borderline findings, high risk, right eye: Secondary | ICD-10-CM | POA: Diagnosis not present

## 2016-12-14 DIAGNOSIS — H40019 Open angle with borderline findings, low risk, unspecified eye: Secondary | ICD-10-CM | POA: Diagnosis not present

## 2016-12-14 DIAGNOSIS — Z961 Presence of intraocular lens: Secondary | ICD-10-CM | POA: Diagnosis not present

## 2016-12-21 ENCOUNTER — Telehealth: Payer: Self-pay | Admitting: Family Medicine

## 2016-12-21 NOTE — Telephone Encounter (Signed)
Please encourage patient to get the fasting labs done soon Thank you

## 2016-12-21 NOTE — Telephone Encounter (Signed)
Called pt informed her of the need to have labs done. Pt gave verbal understanding.

## 2016-12-28 DIAGNOSIS — Z5181 Encounter for therapeutic drug level monitoring: Secondary | ICD-10-CM | POA: Diagnosis not present

## 2016-12-28 DIAGNOSIS — E785 Hyperlipidemia, unspecified: Secondary | ICD-10-CM | POA: Diagnosis not present

## 2016-12-28 LAB — COMPLETE METABOLIC PANEL WITH GFR
AG Ratio: 1.5 (calc) (ref 1.0–2.5)
ALT: 14 U/L (ref 6–29)
AST: 18 U/L (ref 10–35)
Albumin: 4.3 g/dL (ref 3.6–5.1)
Alkaline phosphatase (APISO): 75 U/L (ref 33–130)
BUN: 19 mg/dL (ref 7–25)
CO2: 26 mmol/L (ref 20–32)
Calcium: 9.6 mg/dL (ref 8.6–10.4)
Chloride: 105 mmol/L (ref 98–110)
Creat: 0.82 mg/dL (ref 0.60–0.93)
GFR, Est African American: 79 mL/min/{1.73_m2} (ref >=60)
GFR, Est Non African American: 68 mL/min/{1.73_m2} (ref >=60)
Globulin: 2.8 g/dL (calc) (ref 1.9–3.7)
Glucose, Bld: 85 mg/dL (ref 65–99)
Potassium: 4.5 mmol/L (ref 3.5–5.3)
Sodium: 140 mmol/L (ref 135–146)
Total Bilirubin: 0.4 mg/dL (ref 0.2–1.2)
Total Protein: 7.1 g/dL (ref 6.1–8.1)

## 2016-12-28 LAB — CBC WITH DIFFERENTIAL/PLATELET
Basophils Absolute: 62 cells/uL (ref 0–200)
Basophils Relative: 1.3 %
Eosinophils Absolute: 240 cells/uL (ref 15–500)
Eosinophils Relative: 5 %
HCT: 39.2 % (ref 35.0–45.0)
Hemoglobin: 13.3 g/dL (ref 11.7–15.5)
Lymphs Abs: 1344 cells/uL (ref 850–3900)
MCH: 29 pg (ref 27.0–33.0)
MCHC: 33.9 g/dL (ref 32.0–36.0)
MCV: 85.4 fL (ref 80.0–100.0)
MPV: 10.9 fL (ref 7.5–12.5)
Monocytes Relative: 10 %
Neutro Abs: 2674 cells/uL (ref 1500–7800)
Neutrophils Relative %: 55.7 %
Platelets: 318 10*3/uL (ref 140–400)
RBC: 4.59 10*6/uL (ref 3.80–5.10)
RDW: 12.8 % (ref 11.0–15.0)
Total Lymphocyte: 28 %
WBC mixed population: 480 cells/uL (ref 200–950)
WBC: 4.8 10*3/uL (ref 3.8–10.8)

## 2016-12-28 LAB — LIPID PANEL
Cholesterol: 221 mg/dL — ABNORMAL HIGH (ref <200)
HDL: 35 mg/dL — ABNORMAL LOW (ref >50)
LDL Cholesterol (Calc): 152 mg/dL (calc) — ABNORMAL HIGH
Non-HDL Cholesterol (Calc): 186 mg/dL (calc) — ABNORMAL HIGH (ref <130)
Total CHOL/HDL Ratio: 6.3 (calc) — ABNORMAL HIGH (ref <5.0)
Triglycerides: 202 mg/dL — ABNORMAL HIGH (ref <150)

## 2017-01-03 ENCOUNTER — Encounter: Payer: Self-pay | Admitting: Family Medicine

## 2017-02-17 ENCOUNTER — Other Ambulatory Visit: Payer: Self-pay | Admitting: Family Medicine

## 2017-02-17 DIAGNOSIS — I10 Essential (primary) hypertension: Secondary | ICD-10-CM

## 2017-02-18 NOTE — Telephone Encounter (Signed)
Please see my note to the patient dated 12/30/16 on the labs drawn 12/28/16 Her cholesterol was high and I wanted to have her start a statin Please contact her, see what her response is

## 2017-08-19 DIAGNOSIS — H26061 Combined forms of infantile and juvenile cataract, right eye: Secondary | ICD-10-CM | POA: Diagnosis not present

## 2017-08-19 DIAGNOSIS — H538 Other visual disturbances: Secondary | ICD-10-CM | POA: Diagnosis not present

## 2017-08-19 DIAGNOSIS — H40013 Open angle with borderline findings, low risk, bilateral: Secondary | ICD-10-CM | POA: Diagnosis not present

## 2018-01-01 DIAGNOSIS — H2513 Age-related nuclear cataract, bilateral: Secondary | ICD-10-CM | POA: Diagnosis not present

## 2018-01-01 DIAGNOSIS — H538 Other visual disturbances: Secondary | ICD-10-CM | POA: Diagnosis not present

## 2018-01-01 DIAGNOSIS — H401131 Primary open-angle glaucoma, bilateral, mild stage: Secondary | ICD-10-CM | POA: Diagnosis not present

## 2018-01-01 DIAGNOSIS — H26061 Combined forms of infantile and juvenile cataract, right eye: Secondary | ICD-10-CM | POA: Diagnosis not present

## 2018-01-06 DIAGNOSIS — H40013 Open angle with borderline findings, low risk, bilateral: Secondary | ICD-10-CM | POA: Diagnosis not present

## 2018-01-06 DIAGNOSIS — H2511 Age-related nuclear cataract, right eye: Secondary | ICD-10-CM | POA: Diagnosis not present

## 2018-01-06 DIAGNOSIS — H26492 Other secondary cataract, left eye: Secondary | ICD-10-CM | POA: Diagnosis not present

## 2018-01-06 DIAGNOSIS — H35032 Hypertensive retinopathy, left eye: Secondary | ICD-10-CM | POA: Diagnosis not present

## 2018-01-28 ENCOUNTER — Ambulatory Visit (INDEPENDENT_AMBULATORY_CARE_PROVIDER_SITE_OTHER): Payer: Medicare HMO | Admitting: Family Medicine

## 2018-01-28 ENCOUNTER — Encounter: Payer: Self-pay | Admitting: Family Medicine

## 2018-01-28 VITALS — BP 132/68 | HR 64 | Temp 98.0°F | Ht 64.25 in | Wt 159.5 lb

## 2018-01-28 DIAGNOSIS — I10 Essential (primary) hypertension: Secondary | ICD-10-CM

## 2018-01-28 DIAGNOSIS — R3915 Urgency of urination: Secondary | ICD-10-CM | POA: Diagnosis not present

## 2018-01-28 DIAGNOSIS — N3000 Acute cystitis without hematuria: Secondary | ICD-10-CM

## 2018-01-28 DIAGNOSIS — Z5181 Encounter for therapeutic drug level monitoring: Secondary | ICD-10-CM | POA: Diagnosis not present

## 2018-01-28 DIAGNOSIS — E785 Hyperlipidemia, unspecified: Secondary | ICD-10-CM | POA: Diagnosis not present

## 2018-01-28 LAB — CBC WITH DIFFERENTIAL/PLATELET
Absolute Monocytes: 702 cells/uL (ref 200–950)
Basophils Absolute: 94 cells/uL (ref 0–200)
Basophils Relative: 1.2 %
Eosinophils Absolute: 148 cells/uL (ref 15–500)
Eosinophils Relative: 1.9 %
HCT: 40.5 % (ref 35.0–45.0)
Hemoglobin: 13.5 g/dL (ref 11.7–15.5)
Lymphs Abs: 1513 cells/uL (ref 850–3900)
MCH: 29.4 pg (ref 27.0–33.0)
MCHC: 33.3 g/dL (ref 32.0–36.0)
MCV: 88.2 fL (ref 80.0–100.0)
MPV: 10.9 fL (ref 7.5–12.5)
Monocytes Relative: 9 %
Neutro Abs: 5343 cells/uL (ref 1500–7800)
Neutrophils Relative %: 68.5 %
Platelets: 447 10*3/uL — ABNORMAL HIGH (ref 140–400)
RBC: 4.59 10*6/uL (ref 3.80–5.10)
RDW: 12.3 % (ref 11.0–15.0)
Total Lymphocyte: 19.4 %
WBC: 7.8 10*3/uL (ref 3.8–10.8)

## 2018-01-28 LAB — POCT URINALYSIS DIPSTICK
Bilirubin, UA: NEGATIVE
Blood, UA: NEGATIVE
Glucose, UA: NEGATIVE
Ketones, UA: NEGATIVE
Nitrite, UA: NEGATIVE
Odor: NORMAL
Protein, UA: NEGATIVE
Spec Grav, UA: 1.02 (ref 1.010–1.025)
Urobilinogen, UA: 0.2 E.U./dL
pH, UA: 5.5 (ref 5.0–8.0)

## 2018-01-28 LAB — LIPID PANEL
Cholesterol: 225 mg/dL — ABNORMAL HIGH (ref <200)
HDL: 35 mg/dL — ABNORMAL LOW (ref >50)
LDL Cholesterol (Calc): 154 mg/dL (calc) — ABNORMAL HIGH
Non-HDL Cholesterol (Calc): 190 mg/dL (calc) — ABNORMAL HIGH (ref <130)
Total CHOL/HDL Ratio: 6.4 (calc) — ABNORMAL HIGH (ref <5.0)
Triglycerides: 204 mg/dL — ABNORMAL HIGH (ref <150)

## 2018-01-28 LAB — COMPLETE METABOLIC PANEL WITH GFR
AG Ratio: 1.3 (calc) (ref 1.0–2.5)
ALT: 28 U/L (ref 6–29)
AST: 21 U/L (ref 10–35)
Albumin: 4.2 g/dL (ref 3.6–5.1)
Alkaline phosphatase (APISO): 95 U/L (ref 33–130)
BUN/Creatinine Ratio: 30 (calc) — ABNORMAL HIGH (ref 6–22)
BUN: 27 mg/dL — ABNORMAL HIGH (ref 7–25)
CO2: 28 mmol/L (ref 20–32)
Calcium: 10.3 mg/dL (ref 8.6–10.4)
Chloride: 103 mmol/L (ref 98–110)
Creat: 0.91 mg/dL — ABNORMAL HIGH (ref 0.60–0.88)
GFR, Est African American: 69 mL/min/{1.73_m2} (ref >=60)
GFR, Est Non African American: 60 mL/min/{1.73_m2} (ref >=60)
Globulin: 3.3 g/dL (calc) (ref 1.9–3.7)
Glucose, Bld: 95 mg/dL (ref 65–99)
Potassium: 5.4 mmol/L — ABNORMAL HIGH (ref 3.5–5.3)
Sodium: 141 mmol/L (ref 135–146)
Total Bilirubin: 0.4 mg/dL (ref 0.2–1.2)
Total Protein: 7.5 g/dL (ref 6.1–8.1)

## 2018-01-28 MED ORDER — AMLODIPINE BESYLATE 10 MG PO TABS: 10.0000 mg | ORAL_TABLET | Freq: Every day | ORAL | 3 refills | Status: DC

## 2018-01-28 MED ORDER — BENAZEPRIL HCL 20 MG PO TABS
20.0000 mg | ORAL_TABLET | Freq: Every day | ORAL | 3 refills | Status: DC
Start: 2018-01-28 — End: 2018-01-30

## 2018-01-28 MED ORDER — SULFAMETHOXAZOLE-TRIMETHOPRIM 800-160 MG PO TABS: 1.0000 | ORAL_TABLET | Freq: Two times a day (BID) | ORAL | 0 refills | Status: DC

## 2018-01-28 MED ORDER — DOXYCYCLINE HYCLATE 100 MG PO TABS: 100.0000 mg | ORAL_TABLET | Freq: Two times a day (BID) | ORAL | 0 refills | Status: DC

## 2018-01-28 NOTE — Progress Notes (Signed)
BP 132/68   Pulse 64   Temp 98 F (36.7 C) (Oral)   Ht 5' 4.25" (1.632 m)   Wt 159 lb 8 oz (72.3 kg)   BMI 27.17 kg/m    Subjective:    Patient ID: Michelle Blackwell, female    DOB: July 29, 1935, 82 y.o.   MRN: [id number]  HPI: Michelle Blackwell is a 82 y.o. female  Chief Complaint  Patient presents with  . Urinary Tract Infection    urinary urge, has taken urbal supplements  . Medication Refill    HPI Patient is here for an acute visit She needs a prescription refills  She had a serious UTI and treated it with her antibiotics herbs and it seemed to do the job; however, she says she isn't stupid enough to make sure it is gone; took the herbal remedy for one full week I asked about her symptoms; she got a little feeling in the lower groin area; both sides; no stinging or burning at first, but did through the week later; urinary accidents, 3x through pajamas; urinating "insane"; already taking antibiotics by then; daughter called and she asked if she was okay; daughter said she looked like she was dying; burning sensation; felt pretty miserable; then pain in the back where the kidneys are (pointing to left flank); feeling better by Friday; "you don't play with infection in the body"; they are not as strong as regular meds  High cholesterol; he has hot dogs on occasion; not a big meat eater; likes chicken and fish  High blood pressure; on medicine; needs refills; she noticed that her diastolic has decreased; she has decreased her hawthorne, gingko biloba, she adjusts her herbs; she has no side effects and wants to leave the dose where it is  Working on healthy weight loss; having to really work at getting the weight off; takes a long time; goal is 150 pounds  Depression screen [facility] 2/9 01/28/2018 11/06/2016 03/28/2016 10/31/2015 10/12/2014  Decreased Interest 0 0 0 0 0  Down, Depressed, Hopeless 0 0 0 0 0  PHQ - 2 Score 0 0 0 0 0  Altered sleeping 0 - - - -  Tired, decreased energy 0 - - - -    Change in appetite 0 - - - -  Feeling bad or failure about yourself  0 - - - -  Trouble concentrating 0 - - - -  Moving slowly or fidgety/restless 0 - - - -  Suicidal thoughts 0 - - - -  PHQ-9 Score 0 - - - -  Difficult doing work/chores Not difficult at all - - - -   Fall Risk  01/28/2018 11/06/2016 03/28/2016 10/31/2015 10/12/2014  Falls in the past year? 0 No Yes Yes No  Number falls in past yr: 0 - 1 1 -  Injury with Fall? 0 - Yes No -    Relevant past medical, surgical, family and social history reviewed Past Medical History:  Diagnosis Date  . Allergy   . Anemia   . Arthritis   . Hyperlipidemia   . Hypertension    Past Surgical History:  Procedure Laterality Date  . ABDOMINAL HYSTERECTOMY    . CATARACT EXTRACTION Left 04/29/2016   Family History  Problem Relation Age of Onset  . Cancer Mother        lung cancer; heavy smoker for years  . Heart disease Mother        congestive heart failure  . Emphysema Father   .  Heart disease Brother        not sure, may have had sudden cardiac death  . Hyperlipidemia Sister    Social History   Tobacco Use  . Smoking status: Never Smoker  . Smokeless tobacco: Never Used  Substance Use Topics  . Alcohol use: Yes    Alcohol/week: 0.0 standard drinks    Comment: occasional   . Drug use: No     [facility] Visit from [facility]/28/2018 in [facility]  AUDIT-C Score  1      Interim medical history since last visit reviewed. Allergies and medications reviewed  Review of Systems Per HPI unless specifically indicated above     Objective:    BP 132/68   Pulse 64   Temp 98 F (36.7 C) (Oral)   Ht 5' 4.25" (1.632 m)   Wt 159 lb 8 oz (72.3 kg)   BMI 27.17 kg/m   Wt Readings from Last 3 Encounters:  01/28/18 159 lb 8 oz (72.3 kg)  11/06/16 170 lb 14.4 oz (77.5 kg)  03/28/16 165 lb (74.8 kg)    Physical Exam Constitutional:      Appearance: Normal appearance. She is well-developed. She is not ill-appearing.   Eyes:     General: No scleral icterus. Cardiovascular:     Rate and Rhythm: Normal rate and regular rhythm.  Pulmonary:     Effort: Pulmonary effort is normal.     Breath sounds: Normal breath sounds.  Abdominal:     Tenderness: There is no abdominal tenderness.  Neurological:     Mental Status: She is alert.  Psychiatric:        Behavior: Behavior normal.    Urine reviewed     Assessment & Plan:   Problem List Items Addressed This Visit      Cardiovascular and Mediastinum   Hypertension goal BP (blood pressure) < 140/90 (Chronic)    Continue medicine; refills provided; check Cr and K+      Relevant Medications   amLODipine (NORVASC) 10 MG tablet     Other   Hyperlipidemia LDL goal <100 (Chronic)    Check lipids today      Relevant Medications   amLODipine (NORVASC) 10 MG tablet   Encounter for medication monitoring    Check liver, kidneys      Relevant Orders   COMPLETE METABOLIC PANEL WITH GFR (Completed)   CBC with Differential/Platelet (Completed)    Other Visit Diagnoses    Acute cystitis without hematuria    -  Primary   hydration; avoid fruits high in K+ while on antibiotics; culture pending   Relevant Orders   CBC with Differential/Platelet (Completed)   Urinary urgency       Relevant Orders   POCT urinalysis dipstick (Completed)   Urine Culture (Completed)   Hyperlipidemia, unspecified hyperlipidemia type       Relevant Medications   amLODipine (NORVASC) 10 MG tablet   Other Relevant Orders   Lipid panel (Completed)       Follow up plan: Return in about 1 year (around 01/29/2019) for follow-up visit with Dr. Sanda Klein; Medicare Wellness with [facility] when due.  An after-visit summary was printed and given to the patient at [place].  Please see the patient instructions which may contain other information and recommendations beyond what is mentioned above in the assessment and plan.  Meds ordered this encounter  Medications  . DISCONTD:  benazepril (LOTENSIN) 20 MG tablet    Sig: Take 1 tablet (20 mg  total) by mouth daily.    Dispense:  90 tablet    Refill:  3    Patient requests that these be mailed together if possible  . amLODipine (NORVASC) 10 MG tablet    Sig: Take 1 tablet (10 mg total) by mouth daily.    Dispense:  90 tablet    Refill:  3    Patient requests that these be mailed together if possible  . DISCONTD: sulfamethoxazole-trimethoprim (BACTRIM DS,SEPTRA DS) 800-160 MG tablet    Sig: Take 1 tablet by mouth 2 (two) times daily.    Dispense:  14 tablet    Refill:  0  . DISCONTD: doxycycline (VIBRA-TABS) 100 MG tablet    Sig: Take 1 tablet (100 mg total) by mouth 2 (two) times daily. ** use THIS prescription, NOT the TMP/SMX (Bactrim) **    Dispense:  14 tablet    Refill:  0    Orders Placed This Encounter  Procedures  . Urine Culture  . COMPLETE METABOLIC PANEL WITH GFR  . Lipid panel  . CBC with Differential/Platelet  . POCT urinalysis dipstick

## 2018-01-28 NOTE — Patient Instructions (Addendum)
Use the new antibiotic doxycycline Please do eat yogurt or kimchi or take a probiotic daily for the next month We want to replace the healthy germs in the gut If you notice foul, watery diarrhea in the next two months, schedule an appointment RIGHT AWAY or go to an urgent care or the emergency room if a holiday or over a weekend  Let me know if you would like to decrease the blood pressure medicine  Try to follow the DASH guidelines (DASH stands for Dietary Approaches to Stop Hypertension). Try to limit the sodium in your diet to no more than 1,500mg  of sodium per day. Certainly try to not exceed 2,000 mg per day at the very most. Do not add salt when cooking or at the table.  Check the sodium amount on labels when shopping, and choose items lower in sodium when given a choice. Avoid or limit foods that already contain a lot of sodium. Eat a diet rich in fruits and vegetables and whole grains, and try to lose weight if overweight or obese  Let's get labs today If you have not heard anything from my staff in a week about any orders/referrals/studies from today, please contact us here to follow-up [phone]

## 2018-01-28 NOTE — Assessment & Plan Note (Signed)
Check lipids today 

## 2018-01-28 NOTE — Assessment & Plan Note (Signed)
Check liver, kidneys 

## 2018-01-28 NOTE — Assessment & Plan Note (Signed)
Continue medicine; refills provided; check Cr and K+

## 2018-01-30 ENCOUNTER — Other Ambulatory Visit: Payer: Self-pay | Admitting: Family Medicine

## 2018-01-30 DIAGNOSIS — E875 Hyperkalemia: Secondary | ICD-10-CM

## 2018-01-30 DIAGNOSIS — D473 Essential (hemorrhagic) thrombocythemia: Secondary | ICD-10-CM

## 2018-01-30 DIAGNOSIS — I10 Essential (primary) hypertension: Secondary | ICD-10-CM

## 2018-01-30 DIAGNOSIS — Z5181 Encounter for therapeutic drug level monitoring: Secondary | ICD-10-CM

## 2018-01-30 DIAGNOSIS — E785 Hyperlipidemia, unspecified: Secondary | ICD-10-CM

## 2018-01-30 DIAGNOSIS — D75839 Thrombocytosis, unspecified: Secondary | ICD-10-CM

## 2018-01-30 LAB — URINE CULTURE
MICRO NUMBER:: 406
SPECIMEN QUALITY:: ADEQUATE

## 2018-01-30 MED ORDER — AMOXICILLIN-POT CLAVULANATE 875-125 MG PO TABS: 1.0000 | ORAL_TABLET | Freq: Two times a day (BID) | ORAL | 0 refills | Status: DC

## 2018-01-30 MED ORDER — BENAZEPRIL HCL 20 MG PO TABS: 10.0000 mg | ORAL_TABLET | Freq: Every day | ORAL | Status: DC

## 2018-02-01 ENCOUNTER — Telehealth: Payer: Self-pay | Admitting: Family Medicine

## 2018-02-01 NOTE — Telephone Encounter (Signed)
Please let pt know that I'd like to recheck her potassium this week instead of waiting 4-6 weeks to do that Please ORDER a BMP (dx hyperkalemia) and ask her to come by in the next few days JUST for the BMP I want to make sure it's correcting with the reduction of the ACE-inhibitor Thank you

## 2018-02-03 ENCOUNTER — Other Ambulatory Visit: Payer: Self-pay

## 2018-02-03 DIAGNOSIS — E875 Hyperkalemia: Secondary | ICD-10-CM

## 2018-02-03 NOTE — Telephone Encounter (Signed)
Left detailed VM, lab ordered, CRM created.

## 2018-02-07 ENCOUNTER — Other Ambulatory Visit (INDEPENDENT_AMBULATORY_CARE_PROVIDER_SITE_OTHER): Payer: Medicare HMO

## 2018-02-07 DIAGNOSIS — E875 Hyperkalemia: Secondary | ICD-10-CM | POA: Diagnosis not present

## 2018-02-07 DIAGNOSIS — N390 Urinary tract infection, site not specified: Secondary | ICD-10-CM

## 2018-02-07 LAB — POCT URINALYSIS DIPSTICK
Bilirubin, UA: NEGATIVE
Blood, UA: NEGATIVE
Glucose, UA: NEGATIVE
Ketones, UA: NEGATIVE
Leukocytes, UA: NEGATIVE
Nitrite, UA: NEGATIVE
Protein, UA: NEGATIVE
Spec Grav, UA: 1.015 (ref 1.010–1.025)
Urobilinogen, UA: NEGATIVE E.U./dL — AB
pH, UA: 6.5 (ref 5.0–8.0)

## 2018-02-08 LAB — BASIC METABOLIC PANEL WITH GFR
BUN: 20 mg/dL (ref 7–25)
CO2: 27 mmol/L (ref 20–32)
Calcium: 10 mg/dL (ref 8.6–10.4)
Chloride: 103 mmol/L (ref 98–110)
Creat: 0.88 mg/dL (ref 0.60–0.88)
GFR, Est African American: 72 mL/min/{1.73_m2} (ref >=60)
GFR, Est Non African American: 62 mL/min/{1.73_m2} (ref >=60)
Glucose, Bld: 77 mg/dL (ref 65–99)
Potassium: 4.7 mmol/L (ref 3.5–5.3)
Sodium: 140 mmol/L (ref 135–146)

## 2018-07-15 DIAGNOSIS — H538 Other visual disturbances: Secondary | ICD-10-CM | POA: Diagnosis not present

## 2018-07-15 DIAGNOSIS — H40013 Open angle with borderline findings, low risk, bilateral: Secondary | ICD-10-CM | POA: Diagnosis not present

## 2018-07-15 DIAGNOSIS — H26492 Other secondary cataract, left eye: Secondary | ICD-10-CM | POA: Diagnosis not present

## 2018-07-15 DIAGNOSIS — Z0100 Encounter for examination of eyes and vision without abnormal findings: Secondary | ICD-10-CM | POA: Diagnosis not present

## 2018-07-15 DIAGNOSIS — H26061 Combined forms of infantile and juvenile cataract, right eye: Secondary | ICD-10-CM | POA: Diagnosis not present

## 2018-07-15 DIAGNOSIS — H5203 Hypermetropia, bilateral: Secondary | ICD-10-CM | POA: Diagnosis not present

## 2018-07-15 DIAGNOSIS — H04129 Dry eye syndrome of unspecified lacrimal gland: Secondary | ICD-10-CM | POA: Diagnosis not present

## 2018-07-15 DIAGNOSIS — H2513 Age-related nuclear cataract, bilateral: Secondary | ICD-10-CM | POA: Diagnosis not present

## 2018-07-22 ENCOUNTER — Ambulatory Visit: Payer: Medicare HMO | Admitting: Nurse Practitioner

## 2018-07-22 NOTE — Progress Notes (Deleted)
Name: Michelle Blackwell   MRN: [id number]    DOB: 1935-12-19   Date:07/22/2018       Progress Note  Subjective  Chief Complaint  No chief complaint on file.   HPI   Hypertension Patient is on amlodipine 10mg  daily and benazepril 10mg  daily.  Takes medications as prescribed with ***missed doses a month.  ***compliant with low-salt diet.  ***checks blood pressures at home with range of  Denies chest pain, headaches, blurry vision. BP Readings from Last 3 Encounters:  01/28/18 132/68  11/06/16 (!) 124/58  03/28/16 118/74    Hyperlipidemia Patient takes red yeast rice, fish oil and garlic supplements.  Diet:  Lab Results  Component Value Date   CHOL 225 (H) 01/28/2018   HDL 35 (L) 01/28/2018   LDLCALC 154 (H) 01/28/2018   TRIG 204 (H) 01/28/2018   CHOLHDL 6.4 (H) 01/28/2018   Arthritis Takes garlic, fish oils, turmeric and vitamin D supplements.   PHQ2/9: Depression screen [facility] 2/9 01/28/2018 11/06/2016 03/28/2016 10/31/2015 10/12/2014  Decreased Interest 0 0 0 0 0  Down, Depressed, Hopeless 0 0 0 0 0  PHQ - 2 Score 0 0 0 0 0  Altered sleeping 0 - - - -  Tired, decreased energy 0 - - - -  Change in appetite 0 - - - -  Feeling bad or failure about yourself  0 - - - -  Trouble concentrating 0 - - - -  Moving slowly or fidgety/restless 0 - - - -  Suicidal thoughts 0 - - - -  PHQ-9 Score 0 - - - -  Difficult doing work/chores Not difficult at all - - - -   ***  PHQ reviewed. {Pos/neg/not test:140017::"Negative"}  Patient Active Problem List   Diagnosis Date Noted  . Arthritis 10/12/2014  . Hyperlipidemia LDL goal <100 10/12/2014  . Hypertension goal BP (blood pressure) < 140/90 10/12/2014  . Right knee pain 10/12/2014  . Genu varus 10/12/2014    Past Medical History:  Diagnosis Date  . Allergy   . Anemia   . Arthritis   . Hyperlipidemia   . Hypertension     Past Surgical History:  Procedure Laterality Date  . ABDOMINAL HYSTERECTOMY    . CATARACT EXTRACTION  Left 04/29/2016    Social History   Tobacco Use  . Smoking status: Never Smoker  . Smokeless tobacco: Never Used  Substance Use Topics  . Alcohol use: Yes    Alcohol/week: 0.0 standard drinks    Comment: occasional      Current Outpatient Medications:  .  amLODipine (NORVASC) 10 MG tablet, Take 1 tablet (10 mg total) by mouth daily., Disp: 90 tablet, Rfl: 3 .  amoxicillin-clavulanate (AUGMENTIN) 875-125 MG tablet, Take 1 tablet by mouth 2 (two) times daily., Disp: 14 tablet, Rfl: 0 .  Ascorbic Acid (VITAMIN C) 100 MG tablet, Take 100 mg by mouth daily., Disp: , Rfl:  .  benazepril (LOTENSIN) 20 MG tablet, Take 0.5 tablets (10 mg total) by mouth daily., Disp: , Rfl:  .  Biotin 1000 MCG tablet, Take by mouth., Disp: , Rfl:  .  cholecalciferol (VITAMIN D) 1000 units tablet, Take 1,000 Units by mouth daily., Disp: , Rfl:  .  Cinnamon 500 MG capsule, Take by mouth., Disp: , Rfl:  .  Cyanocobalamin (VITAMIN B-12 ER PO), , Disp: , Rfl:  .  Fenugreek 610 MG CAPS, Take by mouth., Disp: , Rfl:  .  Garlic 8416 MG CAPS, Take 1,000 capsules by mouth  2 (two) times daily., Disp: , Rfl:  .  Ginkgo Biloba 100 MG CAPS, Take 100 mg by mouth 2 (two) times daily., Disp: , Rfl:  .  GINSENG PO, Take by mouth., Disp: , Rfl:  .  Gymnema Sylvestris Leaf POWD, , Disp: , Rfl:  .  HAWTHORN PO, Take by mouth., Disp: , Rfl:  .  Hyaluronic Acid 20-60 MG CAPS, Take by mouth., Disp: , Rfl:  .  LECITHIN PO, , Disp: , Rfl:  .  Multiple Vitamins-Minerals (MULTIVITAMIN ADULTS 50+) TABS, Take by mouth., Disp: , Rfl:  .  Multiple Vitamins-Minerals (VISION VITAMINS PO), , Disp: , Rfl:  .  Omega-3 Fatty Acids (FISH OIL PO), , Disp: , Rfl:  .  Red Yeast Rice 500 MG/0.5GM POWD, Take by mouth., Disp: , Rfl:  .  TURMERIC PO, , Disp: , Rfl:  .  vitamin E 400 UNIT capsule, Take by mouth., Disp: , Rfl:   Allergies  Allergen Reactions  . Ciprofloxacin     green beans - croup, gas & bloating  fresh mango - throat closure &  facial swelling  . Mangifera Indica     ROS  ***  No other specific complaints in a complete review of systems (except as listed in HPI above).  Objective  There were no vitals filed for this visit. ***  There is no height or weight on file to calculate BMI.  Nursing Note and Vital Signs reviewed.  Physical Exam  ***   No results found for this or any previous visit (from the past 48 hour(s)).  Assessment & Plan  1. Hypertension goal BP (blood pressure) < 140/90 ***  2. Arthritis ***  3. Hyperlipidemia LDL goal <100 ***    -Red flags and when to present for emergency care or RTC including fever >101.4F, chest pain, shortness of breath, new/worsening/un-resolving symptoms, *** reviewed with patient at time of visit. Follow up and care instructions discussed and provided in AVS. -Reviewed Health Maintenance: ***

## 2018-07-24 ENCOUNTER — Encounter: Payer: Self-pay | Admitting: Family Medicine

## 2018-07-25 ENCOUNTER — Telehealth: Payer: Self-pay | Admitting: Family Medicine

## 2018-07-25 ENCOUNTER — Ambulatory Visit (INDEPENDENT_AMBULATORY_CARE_PROVIDER_SITE_OTHER): Payer: Medicare HMO | Admitting: Nurse Practitioner

## 2018-07-25 ENCOUNTER — Other Ambulatory Visit: Payer: Self-pay

## 2018-07-25 ENCOUNTER — Encounter: Payer: Self-pay | Admitting: Nurse Practitioner

## 2018-07-25 VITALS — BP 132/65 | HR 60 | Temp 97.0°F | Resp 16 | Ht 64.25 in | Wt 155.0 lb

## 2018-07-25 DIAGNOSIS — I10 Essential (primary) hypertension: Secondary | ICD-10-CM | POA: Diagnosis not present

## 2018-07-25 DIAGNOSIS — Z1211 Encounter for screening for malignant neoplasm of colon: Secondary | ICD-10-CM

## 2018-07-25 DIAGNOSIS — E785 Hyperlipidemia, unspecified: Secondary | ICD-10-CM

## 2018-07-25 DIAGNOSIS — R1031 Right lower quadrant pain: Secondary | ICD-10-CM

## 2018-07-25 NOTE — Progress Notes (Signed)
Virtual Visit via Video Note  I connected with Michelle Blackwell on 07/25/18 at 11:00 AM EDT by a video enabled telemedicine application and verified that I am speaking with the correct person using two identifiers.   Staff discussed the limitations of evaluation and management by telemedicine and the availability of in person appointments. The patient expressed understanding and agreed to proceed.  Patient location: home  My location: work office Other people present: none HPI  Patient endorses lower right quadrant pain intermittently over the past few years; pain is described as burning sensation. Did not notice and relationship with foods or activity that she has noticed.  This last week the pain returned and started shooting pain down right leg lasted for 2-3 days. Pain is resolved now.   Denies nausea, vomiting, diarrhea, constipation, blood in stools, paresthesias, dysuria, vaginal bleeding, vaginal discharge.    Patient would like to get a cologuard test- states she cannot do a colonoscopy. No records of last colonscopy on file and patient does not recall.  Hyperlipidemia Patient is taking many herbal remedies to assist with cholesterol management.  Lab Results  Component Value Date   CHOL 225 (H) 01/28/2018   HDL 35 (L) 01/28/2018   LDLCALC 154 (H) 01/28/2018   TRIG 204 (H) 01/28/2018   CHOLHDL 6.4 (H) 01/28/2018   Hypertension Patient rx amlodipine 10mg  daily & benazepril 10mg  daily    Denies chest pain, headaches, blurry vision. BP Readings from Last 3 Encounters:  07/25/18 132/65  01/28/18 132/68  11/06/16 (!) 124/58        PHQ2/9: Depression screen PHQ 2/9 07/25/2018 01/28/2018 11/06/2016 03/28/2016 10/31/2015  Decreased Interest 0 0 0 0 0  Down, Depressed, Hopeless 0 0 0 0 0  PHQ - 2 Score 0 0 0 0 0  Altered sleeping 0 0 - - -  Tired, decreased energy 0 0 - - -  Change in appetite 0 0 - - -  Feeling bad or failure about yourself  0 0 - - -  Trouble concentrating  0 0 - - -  Moving slowly or fidgety/restless 0 0 - - -  Suicidal thoughts 0 0 - - -  PHQ-9 Score 0 0 - - -  Difficult doing work/chores Not difficult at all Not difficult at all - - -     PHQ reviewed. Negative  Patient Active Problem List   Diagnosis Date Noted  . Arthritis 10/12/2014  . Hyperlipidemia LDL goal <100 10/12/2014  . Hypertension goal BP (blood pressure) < 140/90 10/12/2014  . Right knee pain 10/12/2014  . Genu varus 10/12/2014    Past Medical History:  Diagnosis Date  . Allergy   . Anemia   . Arthritis   . Hyperlipidemia   . Hypertension     Past Surgical History:  Procedure Laterality Date  . ABDOMINAL HYSTERECTOMY    . CATARACT EXTRACTION Left 04/29/2016    Social History   Tobacco Use  . Smoking status: Never Smoker  . Smokeless tobacco: Never Used  Substance Use Topics  . Alcohol use: Yes    Alcohol/week: 0.0 standard drinks    Comment: occasional      Current Outpatient Medications:  .  amLODipine (NORVASC) 10 MG tablet, Take 1 tablet (10 mg total) by mouth daily., Disp: 90 tablet, Rfl: 3 .  Ascorbic Acid (VITAMIN C) 100 MG tablet, Take 100 mg by mouth daily., Disp: , Rfl:  .  benazepril (LOTENSIN) 20 MG tablet, Take 0.5 tablets (10 mg total)  by mouth daily., Disp: , Rfl:  .  Biotin 1000 MCG tablet, Take by mouth., Disp: , Rfl:  .  cholecalciferol (VITAMIN D) 1000 units tablet, Take 1,000 Units by mouth daily., Disp: , Rfl:  .  Cinnamon 500 MG capsule, Take by mouth., Disp: , Rfl:  .  Cyanocobalamin (VITAMIN B-12 ER PO), , Disp: , Rfl:  .  Fenugreek 610 MG CAPS, Take by mouth., Disp: , Rfl:  .  Garlic 3846 MG CAPS, Take 1,000 capsules by mouth 2 (two) times daily., Disp: , Rfl:  .  Ginkgo Biloba 100 MG CAPS, Take 100 mg by mouth 2 (two) times daily., Disp: , Rfl:  .  GINSENG PO, Take by mouth., Disp: , Rfl:  .  Gymnema Sylvestris Leaf POWD, , Disp: , Rfl:  .  HAWTHORN PO, Take by mouth., Disp: , Rfl:  .  Hyaluronic Acid 20-60 MG CAPS,  Take by mouth., Disp: , Rfl:  .  LECITHIN PO, , Disp: , Rfl:  .  Multiple Vitamins-Minerals (MULTIVITAMIN ADULTS 50+) TABS, Take by mouth., Disp: , Rfl:  .  Multiple Vitamins-Minerals (VISION VITAMINS PO), , Disp: , Rfl:  .  Omega-3 Fatty Acids (FISH OIL PO), , Disp: , Rfl:  .  Red Yeast Rice 500 MG/0.5GM POWD, Take by mouth., Disp: , Rfl:  .  TURMERIC PO, , Disp: , Rfl:  .  vitamin E 400 UNIT capsule, Take by mouth., Disp: , Rfl:  .  amoxicillin-clavulanate (AUGMENTIN) 875-125 MG tablet, Take 1 tablet by mouth 2 (two) times daily. (Patient not taking: Reported on 07/25/2018), Disp: 14 tablet, Rfl: 0  Allergies  Allergen Reactions  . Ciprofloxacin     green beans - croup, gas & bloating  fresh mango - throat closure & facial swelling  . Mangifera Indica     ROS   No other specific complaints in a complete review of systems (except as listed in HPI above).  Objective  Vitals:   07/25/18 1036 07/25/18 1119  BP: (!) 122/55 132/65  Pulse: (!) 50 60  Resp:  16  Temp: (!) 97 F (36.1 C)   Weight: 155 lb (70.3 kg)   Height: 5' 4.25" (1.632 m)      Body mass index is 26.4 kg/m.  Nursing Note and Vital Signs reviewed.  Physical Exam Abdominal:        Constitutional: Patient appears well-developed and well-nourished. No distress.  HENT: Head: Normocephalic and atraumatic. Cardiovascular: normal rate Pulmonary/Chest: Effort normal  Musculoskeletal: Normal range of motion,  Neurological: alert and oriented, speech normal.  Skin: No rash noted. No erythema.  Psychiatric: Patient has a normal mood and affect. behavior is normal. Judgment and thought content normal.    Assessment & Plan  1. Right lower quadrant pain Self resolved, monitor - CBC w/Diff/Platelet - COMPLETE METABOLIC PANEL WITH GFR  2. Screening for colon cancer Patient will check with insurance for coverage.  - Cologuard  3. Hyperlipidemia LDL goal <100 diet - Lipid Profile  4. Hypertension  goal BP (blood pressure) < 140/90 Stable     Follow Up Instructions:    I discussed the assessment and treatment plan with the patient. The patient was provided an opportunity to ask questions and all were answered. The patient agreed with the plan and demonstrated an understanding of the instructions.   The patient was advised to call back or seek an in-person evaluation if the symptoms worsen or if the condition fails to improve as anticipated.  I provided  21 minutes of non-face-to-face time during this encounter.   Fredderick Severance, NP

## 2018-07-25 NOTE — Telephone Encounter (Signed)
Patient is calling she regarding a MyChart message. Please advise

## 2018-07-28 ENCOUNTER — Telehealth: Payer: Self-pay

## 2018-07-28 NOTE — Telephone Encounter (Signed)
Pt notified that cologuard will come to her home.   Copied from [place] [phone]. Topic: General - Call Back - No Documentation >> Jul 25, 2018 12:16 PM Erick Blinks wrote: Reason for CRM: Requesting call back to inquire about cologuard kit. She wants to know if she needs to come in or if it will be mailed to her. Also reported that insurance covers this.  [phone]

## 2018-07-28 NOTE — Telephone Encounter (Signed)
Returned patients call.

## 2018-08-10 DIAGNOSIS — Z1211 Encounter for screening for malignant neoplasm of colon: Secondary | ICD-10-CM | POA: Diagnosis not present

## 2018-08-14 ENCOUNTER — Telehealth: Payer: Self-pay | Admitting: Family Medicine

## 2018-08-14 DIAGNOSIS — E785 Hyperlipidemia, unspecified: Secondary | ICD-10-CM | POA: Diagnosis not present

## 2018-08-14 NOTE — Telephone Encounter (Signed)
Patient with symptoms need to be addressed in a visit- can do evisit as there is no availability today.

## 2018-08-14 NOTE — Telephone Encounter (Signed)
Patient requesting urine orders due to possible ?UTI, patient will arrive in 20 minutes for lab draw, please advise

## 2018-08-14 NOTE — Telephone Encounter (Signed)
Pt has already came in for labs

## 2018-08-14 NOTE — Telephone Encounter (Signed)
Spoke with pt and she stated that she will see how it goes. Pt did not schedule appt

## 2018-08-15 LAB — LIPID PANEL
Cholesterol: 244 mg/dL — ABNORMAL HIGH (ref <200)
HDL: 35 mg/dL — ABNORMAL LOW (ref >=50)
LDL Cholesterol (Calc): 163 mg/dL (calc) — ABNORMAL HIGH
Non-HDL Cholesterol (Calc): 209 mg/dL (calc) — ABNORMAL HIGH (ref <130)
Total CHOL/HDL Ratio: 7 (calc) — ABNORMAL HIGH (ref <5.0)
Triglycerides: 296 mg/dL — ABNORMAL HIGH (ref <150)

## 2018-08-18 ENCOUNTER — Encounter: Payer: Self-pay | Admitting: Nurse Practitioner

## 2018-08-18 LAB — COLOGUARD: Cologuard: NEGATIVE

## 2018-09-08 DIAGNOSIS — G8929 Other chronic pain: Secondary | ICD-10-CM | POA: Diagnosis not present

## 2018-09-08 DIAGNOSIS — Z7689 Persons encountering health services in other specified circumstances: Secondary | ICD-10-CM | POA: Diagnosis not present

## 2018-09-08 DIAGNOSIS — R1031 Right lower quadrant pain: Secondary | ICD-10-CM | POA: Diagnosis not present

## 2018-09-08 DIAGNOSIS — Z87440 Personal history of urinary (tract) infections: Secondary | ICD-10-CM | POA: Diagnosis not present

## 2018-09-08 DIAGNOSIS — I10 Essential (primary) hypertension: Secondary | ICD-10-CM | POA: Diagnosis not present

## 2018-09-10 ENCOUNTER — Other Ambulatory Visit: Payer: Self-pay | Admitting: Medical Oncology

## 2018-09-10 DIAGNOSIS — R1031 Right lower quadrant pain: Secondary | ICD-10-CM

## 2018-09-10 DIAGNOSIS — G8929 Other chronic pain: Secondary | ICD-10-CM

## 2018-09-15 DIAGNOSIS — Z7689 Persons encountering health services in other specified circumstances: Secondary | ICD-10-CM | POA: Diagnosis not present

## 2018-09-15 DIAGNOSIS — R399 Unspecified symptoms and signs involving the genitourinary system: Secondary | ICD-10-CM | POA: Diagnosis not present

## 2018-09-15 DIAGNOSIS — R1031 Right lower quadrant pain: Secondary | ICD-10-CM | POA: Diagnosis not present

## 2018-09-15 DIAGNOSIS — L989 Disorder of the skin and subcutaneous tissue, unspecified: Secondary | ICD-10-CM | POA: Diagnosis not present

## 2018-09-15 DIAGNOSIS — I10 Essential (primary) hypertension: Secondary | ICD-10-CM | POA: Diagnosis not present

## 2018-09-16 ENCOUNTER — Other Ambulatory Visit: Payer: Self-pay

## 2018-09-16 ENCOUNTER — Ambulatory Visit
Admission: RE | Admit: 2018-09-16 | Discharge: 2018-09-16 | Disposition: A | Discharge status: Home / Self Care | Payer: Medicare HMO | Source: Ambulatory Visit | Attending: Medical Oncology | Admitting: Medical Oncology

## 2018-09-16 ENCOUNTER — Other Ambulatory Visit: Payer: Self-pay | Admitting: Medical Oncology

## 2018-09-16 DIAGNOSIS — R102 Pelvic and perineal pain: Secondary | ICD-10-CM | POA: Diagnosis not present

## 2018-09-16 DIAGNOSIS — G8929 Other chronic pain: Secondary | ICD-10-CM

## 2018-09-16 DIAGNOSIS — R1031 Right lower quadrant pain: Secondary | ICD-10-CM | POA: Diagnosis not present

## 2018-09-25 DIAGNOSIS — L578 Other skin changes due to chronic exposure to nonionizing radiation: Secondary | ICD-10-CM | POA: Diagnosis not present

## 2018-09-25 DIAGNOSIS — C44329 Squamous cell carcinoma of skin of other parts of face: Secondary | ICD-10-CM | POA: Diagnosis not present

## 2018-09-25 DIAGNOSIS — D485 Neoplasm of uncertain behavior of skin: Secondary | ICD-10-CM | POA: Diagnosis not present

## 2018-10-15 DIAGNOSIS — C44329 Squamous cell carcinoma of skin of other parts of face: Secondary | ICD-10-CM | POA: Diagnosis not present

## 2018-10-28 ENCOUNTER — Encounter: Payer: Self-pay | Admitting: Family Medicine

## 2019-01-02 ENCOUNTER — Ambulatory Visit
Admission: EM | Admit: 2019-01-02 | Discharge: 2019-01-02 | Disposition: A | Discharge status: Home / Self Care | Payer: Medicare HMO | Attending: Family Medicine | Admitting: Family Medicine

## 2019-01-02 ENCOUNTER — Encounter: Payer: Self-pay | Admitting: Emergency Medicine

## 2019-01-02 ENCOUNTER — Other Ambulatory Visit: Payer: Self-pay

## 2019-01-02 DIAGNOSIS — N390 Urinary tract infection, site not specified: Secondary | ICD-10-CM

## 2019-01-02 DIAGNOSIS — R6889 Other general symptoms and signs: Secondary | ICD-10-CM | POA: Diagnosis not present

## 2019-01-02 LAB — URINALYSIS, COMPLETE (UACMP) WITH MICROSCOPIC
Glucose, UA: NEGATIVE mg/dL
Nitrite: NEGATIVE
Protein, ur: 30 mg/dL — AB
Specific Gravity, Urine: 1.015 (ref 1.005–1.030)
WBC, UA: >50 WBC/hpf (ref 0–5)
pH: 5 (ref 5.0–8.0)

## 2019-01-02 MED ORDER — SULFAMETHOXAZOLE-TRIMETHOPRIM 800-160 MG PO TABS: 1.0000 | ORAL_TABLET | Freq: Two times a day (BID) | ORAL | 0 refills | Status: AC

## 2019-01-02 NOTE — ED Triage Notes (Signed)
Patient in today c/o 4 day history of urinary urgency and incontinence. Patient has been taking herbal antibiotics without relief. Patient sweated through her night shirt on Tuesday night (12/30/18), but hasn't taken her temperature.

## 2019-01-02 NOTE — ED Provider Notes (Signed)
[place], [place]   Name: Michelle Blackwell DOB: [birth year] MRN: [id number] CSN: [id number] PCP: Arnetha Courser, MD  Arrival date and time:  01/02/19 1122  Chief Complaint:  Urinary Frequency   NOTE: Prior to seeing the patient today, I have reviewed the triage nursing documentation and vital signs. Clinical staff has updated patient's PMH/PSHx, current medication list, and drug allergies/intolerances to ensure comprehensive history available to assist in medical decision making.   History:   HPI: Michelle Blackwell is a 83 y.o. female who presents today with complaints of urinary symptoms that began with acute onset 4 days ago. She complains of frequency and urgency; no dysuria. Patient has a history of stress incontinence, however notes that her incontinence episodes have increased due to her having to go to the bathroom so often. She has not appreciated any gross hematuria, nor has she noticed her urine being malodorous. Patient denies any associated nausea, vomiting, or chills. She is unsure if she has had a fever or not. Patient reports that she woke up on Tuesday night with her undershirt being "soaked", however she did not take her temperature. She has not experienced any pain in her lower back or flank areas. She notes suprapubic pressure that causes her to have intermittent "twinges" of pain. Patient has been taking herbal supplements in efforts to try to self treat her infection at home. Patient states, "I got the same thing last year so I knew it was an infection. The stuff I have been taking just has not worked". EMR reviewed. Patient with UTI in 12/2017; C&S grew out E.coli.   Past Medical History:  Diagnosis Date  . Allergy   . Anemia   . Arthritis   . Hyperlipidemia   . Hypertension     Past Surgical History:  Procedure Laterality Date  . ABDOMINAL HYSTERECTOMY    . CATARACT EXTRACTION Left 04/29/2016    Family History  Problem Relation Age of Onset  . Cancer Mother        lung  cancer; heavy smoker for years  . Heart disease Mother        congestive heart failure  . Emphysema Father   . Heart disease Brother        not sure, may have had sudden cardiac death  . Hyperlipidemia Sister     Social History   Tobacco Use  . Smoking status: Never Smoker  . Smokeless tobacco: Never Used  Substance Use Topics  . Alcohol use: Yes    Alcohol/week: 0.0 standard drinks    Comment: occasional   . Drug use: No    Patient Active Problem List   Diagnosis Date Noted  . Arthritis 10/12/2014  . Hyperlipidemia LDL goal <100 10/12/2014  . Hypertension goal BP (blood pressure) < 140/90 10/12/2014  . Right knee pain 10/12/2014  . Genu varus 10/12/2014    Home Medications:    Current Meds  Medication Sig  . amLODipine (NORVASC) 10 MG tablet Take 1 tablet (10 mg total) by mouth daily.  . Ascorbic Acid (VITAMIN C) 100 MG tablet Take 100 mg by mouth daily.  . benazepril (LOTENSIN) 20 MG tablet Take 0.5 tablets (10 mg total) by mouth daily.  . Biotin 1000 MCG tablet Take by mouth.  . cholecalciferol (VITAMIN D) 1000 units tablet Take 1,000 Units by mouth daily.  . Cyanocobalamin (VITAMIN B-12 ER PO)   . Fenugreek 610 MG CAPS Take by mouth.  . Garlic 123XX123 MG CAPS Take 1,000 capsules by mouth  2 (two) times daily.  . Ginkgo Biloba 100 MG CAPS Take 100 mg by mouth 2 (two) times daily.  Marland Kitchen GINSENG PO Take by mouth.  Kelli Churn Sylvestris Leaf POWD   . HAWTHORN PO Take by mouth.  . Hyaluronic Acid 20-60 MG CAPS Take by mouth.  . LECITHIN PO   . Multiple Vitamins-Minerals (MULTIVITAMIN ADULTS 50+) TABS Take by mouth.  . Omega-3 Fatty Acids (FISH OIL PO)   . Red Yeast Rice 500 MG/0.5GM POWD Take by mouth.  . TURMERIC PO   . vitamin E 400 UNIT capsule Take by mouth.    Allergies:   Ciprofloxacin and Mangifera indica  Review of Systems (ROS): Review of Systems  Constitutional: Negative for chills and fever.  Respiratory: Negative for cough and shortness of breath.    Cardiovascular: Negative for chest pain and palpitations.  Genitourinary: Positive for frequency and urgency. Negative for dysuria, flank pain and hematuria.       PMH (+) stress incontinence  Musculoskeletal: Negative for back pain.  All other systems reviewed and are negative.    Vital Signs: Today's Vitals   01/02/19 1139 01/02/19 1140 01/02/19 1215  BP:  (!) 107/56   Pulse:  70   Resp:  18   Temp:  98.5 F (36.9 C)   TempSrc:  Oral   SpO2:  98%   Weight:  156 lb (70.8 kg)   Height:  5\' 6"  (1.676 m)   PainSc: 2   2     Physical Exam: Physical Exam  Constitutional: She is oriented to person, place, and time and well-developed, well-nourished, and in no distress.  HENT:  Head: Normocephalic and atraumatic.  Mouth/Throat: Mucous membranes are normal.  Eyes: Pupils are equal, round, and reactive to light.  Cardiovascular: Normal rate, regular rhythm, normal heart sounds and intact distal pulses. Exam reveals no gallop and no friction rub.  No murmur heard. Pulmonary/Chest: Effort normal and breath sounds normal. No respiratory distress. She has no wheezes. She has no rales.  Abdominal: Soft. Normal appearance. There is no hepatosplenomegaly. There is abdominal tenderness in the suprapubic area. There is no CVA tenderness.  Neurological: She is alert and oriented to person, place, and time. Gait normal.  Skin: Skin is warm and dry. No rash noted.  Psychiatric: Mood, memory, affect and judgment normal.  Nursing note and vitals reviewed.   Urgent Care Treatments / Results:   LABS: PLEASE NOTE: all labs that were ordered this encounter are listed, however only abnormal results are displayed. Labs Reviewed  URINALYSIS, COMPLETE (UACMP) WITH MICROSCOPIC - Abnormal; Notable for the following components:      Result Value   APPearance CLOUDY (*)    Hgb urine dipstick SMALL (*)    Bilirubin Urine MODERATE (*)    Ketones, ur TRACE (*)    Protein, ur 30 (*)    Leukocytes,Ua  SMALL (*)    Bacteria, UA MANY (*)    All other components within normal limits  URINE CULTURE    EKG: -None  RADIOLOGY: No results found.  PROCEDURES: Procedures  MEDICATIONS RECEIVED THIS VISIT: Medications - No data to display  PERTINENT CLINICAL COURSE NOTES/UPDATES:   Initial Impression / Assessment and Plan / Urgent Care Course:  Pertinent labs & imaging results that were available during my care of the patient were personally reviewed by me and considered in my medical decision making (see lab/imaging section of note for values and interpretations).  Michelle Blackwell is a 83 y.o. female  who presents to [facility] today with complaints of Urinary Frequency   Patient is well appearing overall in clinic today. She does not appear to be in any acute distress. Presenting symptoms (see HPI) and exam as documented above. UA was (+) for infection; reflex culture sent. Will treat with a 5 day course of SMZ-TMP. Patient encouraged to complete the entire course of antibiotics even if she begins to feel better. She was advised that if culture demonstrates resistance to the prescribed antibiotic, she will be contacted and advised of the need to change the antibiotic being used to treat her infection. Patient encouraged to increase her fluid intake as much as possible. Discussed that water is always best to flush the urinary tract. She was advised to avoid caffeine containing fluids until her infections clears, as caffeine can cause her to experience painful bladder spasms. May use Tylenol and/or Ibuprofen as needed for pain/fever.   Discussed follow up with primary care physician in 1 week for re-evaluation. I have reviewed the follow up and strict return precautions for any new or worsening symptoms. Patient is aware of symptoms that would be deemed urgent/emergent, and would thus require further evaluation either here or in the emergency department. At the time of discharge, she  verbalized understanding and consent with the discharge plan as it was reviewed with her. All questions were fielded by provider and/or clinic staff prior to patient discharge.    Final Clinical Impressions / Urgent Care Diagnoses:   Final diagnoses:  Urinary tract infection without hematuria, site unspecified    New Prescriptions:  [place] Controlled Substance Registry consulted? Not Applicable  Meds ordered this encounter  Medications  . sulfamethoxazole-trimethoprim (BACTRIM DS) 800-160 MG tablet    Sig: Take 1 tablet by mouth 2 (two) times daily for 5 days.    Dispense:  10 tablet    Refill:  0    Recommended Follow up Care:  Patient encouraged to follow up with the following provider within the specified time frame, or sooner as dictated by the severity of her symptoms. As always, she was instructed that for any urgent/emergent care needs, she should seek care either here or in the emergency department for more immediate evaluation.  Follow-up Information    Lada, Satira Anis, MD In 1 week.   Specialty: Family Medicine Why: General reassessment of symptoms if not improving Contact information: [address] [place] [id number] [phone]         NOTE: This note was prepared using Dragon dictation software along with smaller phrase technology. Despite my best ability to proofread, there is the potential that transcriptional errors may still occur from this process, and are completely unintentional.    Karen Kitchens, NP 01/03/19 [phone]

## 2019-01-02 NOTE — Discharge Instructions (Addendum)
It was very nice seeing you today in clinic. Thank you for entrusting me with your care.   As discussed, your urine is POSITIVE for infection. Will approach treatment as follows:  Prescription has been sent to your pharmacy for antibiotics.  Please pick up and take as directed. FINISH the entire course of medication even if you are feeling better.  A culture will be sent on your provided sample. If it comes back resistant to what I have prescribed you, someone will call you and let you know that we will need to change antibiotics. Increase fluid intake as much as possible to flush your urinary tract.  Water is always the best.  Avoid caffeine until your infection clears up, as it can contribute to painful bladder spasms.  May use Tylenol and/or Ibuprofen as needed for pain/fever.  Make arrangements to follow up with your regular doctor in 1 week for re-evaluation if not improving. If your symptoms/condition worsens, please seek follow up care either here or in the ER. Please remember, our [facility] providers are "right here with you" when you need [place].   Again, it was my pleasure to take care of you today. Thank you for choosing our clinic. I hope that you start to feel better quickly.   Honor Loh, MSN, APRN, FNP-C, CEN Advanced Practice Provider [facility]

## 2019-01-04 LAB — URINE CULTURE: Culture: >=100000 — AB

## 2019-01-07 ENCOUNTER — Ambulatory Visit (INDEPENDENT_AMBULATORY_CARE_PROVIDER_SITE_OTHER): Payer: Medicare HMO | Admitting: Family Medicine

## 2019-01-07 ENCOUNTER — Encounter: Payer: Self-pay | Admitting: Family Medicine

## 2019-01-07 ENCOUNTER — Other Ambulatory Visit: Payer: Self-pay

## 2019-01-07 VITALS — BP 130/62 | HR 66 | Temp 97.8°F | Resp 14 | Ht 64.0 in | Wt 164.9 lb

## 2019-01-07 DIAGNOSIS — I10 Essential (primary) hypertension: Secondary | ICD-10-CM | POA: Diagnosis not present

## 2019-01-07 DIAGNOSIS — R7301 Impaired fasting glucose: Secondary | ICD-10-CM | POA: Diagnosis not present

## 2019-01-07 DIAGNOSIS — Z5181 Encounter for therapeutic drug level monitoring: Secondary | ICD-10-CM

## 2019-01-07 DIAGNOSIS — R1031 Right lower quadrant pain: Secondary | ICD-10-CM | POA: Diagnosis not present

## 2019-01-07 DIAGNOSIS — R3911 Hesitancy of micturition: Secondary | ICD-10-CM

## 2019-01-07 DIAGNOSIS — E559 Vitamin D deficiency, unspecified: Secondary | ICD-10-CM | POA: Diagnosis not present

## 2019-01-07 DIAGNOSIS — N3941 Urge incontinence: Secondary | ICD-10-CM

## 2019-01-07 DIAGNOSIS — R350 Frequency of micturition: Secondary | ICD-10-CM | POA: Diagnosis not present

## 2019-01-07 DIAGNOSIS — E782 Mixed hyperlipidemia: Secondary | ICD-10-CM | POA: Diagnosis not present

## 2019-01-07 DIAGNOSIS — E785 Hyperlipidemia, unspecified: Secondary | ICD-10-CM | POA: Diagnosis not present

## 2019-01-07 LAB — POCT URINALYSIS DIPSTICK
Bilirubin, UA: NEGATIVE
Glucose, UA: NEGATIVE
Ketones, UA: NEGATIVE
Nitrite, UA: NEGATIVE
Odor: NORMAL
Protein, UA: NEGATIVE
Spec Grav, UA: 1.02 (ref 1.010–1.025)
Urobilinogen, UA: 0.2 E.U./dL
pH, UA: 5.5 (ref 5.0–8.0)

## 2019-01-07 MED ORDER — BENAZEPRIL HCL 20 MG PO TABS: 10.0000 mg | ORAL_TABLET | Freq: Every day | ORAL | 3 refills | Status: AC

## 2019-01-07 MED ORDER — AMLODIPINE BESYLATE 10 MG PO TABS: 10.0000 mg | ORAL_TABLET | Freq: Every day | ORAL | 3 refills | Status: AC

## 2019-01-07 NOTE — Patient Instructions (Signed)
Med refills went to [facility].  We will call you with your lab results.  Please schedule a routine follow office visit in 6 months. Blood pressure goal would be between 110/60 to 140/90  Come back sooner as needed for abdominal pain - Evaluation would be more helpful while you are having symptoms.

## 2019-01-07 NOTE — Progress Notes (Signed)
Name: Michelle Blackwell   MRN: [id number]    DOB: [birth year]   Date:01/07/2019       Progress Note  Chief Complaint  Patient presents with  . Follow-up  . Hypertension  . Medication Refill  . Urinary Tract Infection    went to urgent care last week dx with UTI frequency.  wants recheck      Subjective:   Michelle Blackwell is a 83 y.o. female, presents to clinic for routine follow up on the conditions listed above.  Ms. Solomon/ Dr. Leamon Arnt new to me, here for routine f/up  Hypertension:  Currently managed on benazepril and norvasc Pt reports good med compliance and denies any SE.  No lightheadedness, hypotension, syncope. Blood pressure today is well controlled. BP Readings from Last 3 Encounters:  01/07/19 130/62  01/02/19 (!) 107/56  07/25/18 132/65   Pt denies CP, SOB, exertional sx, LE edema, palpitation, Ha's, visual disturbances Dietary efforts for BP?  healthy   HLD - not on meds - treating with her lifestyle and supplements, no statin Last Lipids: Lab Results  Component Value Date   CHOL 244 (H) 08/14/2018   HDL 35 (L) 08/14/2018   LDLCALC 163 (H) 08/14/2018   TRIG 296 (H) 08/14/2018   CHOLHDL 7.0 (H) 08/14/2018  - Current Diet:  healthy - Denies: Chest pain, shortness of breath, myalgias. - Documented aortic atherosclerosis? 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      Recent UTI went to [facility] 01/02/2019 treated with bactrim, sx are mostly better but she feels something is still wrong.  She did UA dip for [place] already - significant for 2+ leuks and trace blood, no nitrites, recent culture was reviewed, + for E. Coli and was pansensitive treated with bactrim.  Results for orders placed or performed in visit on 01/07/19  POCT Urinalysis Dipstick  Result Value Ref Range   Color, UA yellow    Clarity, UA clear    Glucose, UA Negative  Negative   Bilirubin, UA neg    Ketones, UA neg    Spec Grav, UA 1.020 1.010 - 1.025   Blood, UA trace    pH, UA 5.5 5.0 - 8.0   Protein, UA Negative Negative   Urobilinogen, UA 0.2 0.2 or 1.0 E.U./dL   Nitrite, UA neg    Leukocytes, UA Moderate (2+) (A) Negative   Appearance clear    Odor normal    Something is still a little off since UTI sx for over a week, UC 5 days ago, she has some stress incontinence and with UTI she has worse incontinence, urgency, frequency, more nocturia some balance issues  No abd pain, N, V, decreased appetite, vag sx, hematuria. Over all her urinary sx are a little better, her incontinence is much better than her baseline and that concerns her that she may be retaining urine.    She mentions RLQ pain in the past, no pain right now, but she wants to also address why she would have intermittent RLQ pain that is burning and sharp, sometime radiates around her right hip or shoots down leg - she states she has had a neg [place] of uterus, ovaries, colonoscopy.  Wants to know whats wrong.  No bowel changes, constant abd pain, masses/bulges in groin area, unintensional weight loss.  Explained that to help her I would like for her to get a  f/up appt dedicated to abd pain, or to come when having sx, in order to do proper history of focused exam   Patient Active Problem List   Diagnosis Date Noted  . Arthritis 10/12/2014  . Hyperlipidemia LDL goal <100 10/12/2014  . Hypertension goal BP (blood pressure) < 140/90 10/12/2014  . Right knee pain 10/12/2014  . Genu varus 10/12/2014    Past Surgical History:  Procedure Laterality Date  . ABDOMINAL HYSTERECTOMY    . CATARACT EXTRACTION Left 04/29/2016    Family History  Problem Relation Age of Onset  . Cancer Mother        lung cancer; heavy smoker for years  . Heart disease Mother        congestive heart failure  . Emphysema Father   . Heart disease Brother        not sure, may have had sudden cardiac death  .  Hyperlipidemia Sister     Social History   Socioeconomic History  . Marital status: Single    Spouse name: Not on file  . Number of children: Not on file  . Years of education: Not on file  . Highest education level: Not on file  Occupational History  . Not on file  Social Needs  . Financial resource strain: Not on file  . Food insecurity    Worry: Not on file    Inability: Not on file  . Transportation needs    Medical: Not on file    Non-medical: Not on file  Tobacco Use  . Smoking status: Never Smoker  . Smokeless tobacco: Never Used  Substance and Sexual Activity  . Alcohol use: Yes    Alcohol/week: 0.0 standard drinks    Comment: occasional   . Drug use: No  . Sexual activity: Never  Lifestyle  . Physical activity    Days per week: Not on file    Minutes per session: Not on file  . Stress: Not on file  Relationships  . Social Herbalist on phone: Not on file    Gets together: Not on file    Attends religious service: Not on file    Active member of club or organization: Not on file    Attends meetings of clubs or organizations: Not on file    Relationship status: Not on file  . Intimate partner violence    Fear of current or ex partner: Not on file    Emotionally abused: Not on file    Physically abused: Not on file    Forced sexual activity: Not on file  Other Topics Concern  . Not on file  Social History Narrative  . Not on file     Current Outpatient Medications:  .  amLODipine (NORVASC) 10 MG tablet, Take 1 tablet (10 mg total) by mouth daily., Disp: 90 tablet, Rfl: 3 .  Ascorbic Acid (VITAMIN C) 100 MG tablet, Take 100 mg by mouth daily., Disp: , Rfl:  .  benazepril (LOTENSIN) 20 MG tablet, Take 0.5 tablets (10 mg total) by mouth daily., Disp: , Rfl:  .  Biotin 1000 MCG tablet, Take by mouth., Disp: , Rfl:  .  cholecalciferol (VITAMIN D) 1000 units tablet, Take 1,000 Units by mouth daily., Disp: , Rfl:  .  Cyanocobalamin (VITAMIN B-12  ER PO), , Disp: , Rfl:  .  Fenugreek 610 MG CAPS, Take by mouth., Disp: , Rfl:  .  Garlic 123XX123 MG CAPS, Take 1,000 capsules by mouth 2 (  two) times daily., Disp: , Rfl:  .  Ginkgo Biloba 100 MG CAPS, Take 100 mg by mouth 2 (two) times daily., Disp: , Rfl:  .  GINSENG PO, Take by mouth., Disp: , Rfl:  .  Gymnema Sylvestris Leaf POWD, , Disp: , Rfl:  .  HAWTHORN PO, Take by mouth., Disp: , Rfl:  .  Hyaluronic Acid 20-60 MG CAPS, Take by mouth., Disp: , Rfl:  .  LECITHIN PO, , Disp: , Rfl:  .  Multiple Vitamins-Minerals (MULTIVITAMIN ADULTS 50+) TABS, Take by mouth., Disp: , Rfl:  .  Omega-3 Fatty Acids (FISH OIL PO), , Disp: , Rfl:  .  Red Yeast Rice 500 MG/0.5GM POWD, Take by mouth., Disp: , Rfl:  .  sulfamethoxazole-trimethoprim (BACTRIM DS) 800-160 MG tablet, Take 1 tablet by mouth 2 (two) times daily for 5 days., Disp: 10 tablet, Rfl: 0 .  TURMERIC PO, , Disp: , Rfl:  .  vitamin E 400 UNIT capsule, Take by mouth., Disp: , Rfl:   Allergies  Allergen Reactions  . Ciprofloxacin     green beans - croup, gas & bloating  fresh mango - throat closure & facial swelling  . Mangifera Indica     I personally reviewed active problem list, medication list, allergies, family history, social history, health maintenance, notes from last encounter, lab results, imaging with the patient/caregiver today.  Review of Systems  Constitutional: Negative.   HENT: Negative.   Eyes: Negative.   Respiratory: Negative.   Cardiovascular: Negative.   Gastrointestinal: Positive for abdominal pain (intermittent burning RLQ abdominal pain, none now). Negative for abdominal distention, anal bleeding, blood in stool, constipation, diarrhea, nausea, rectal pain and vomiting.  Endocrine: Negative.   Genitourinary: Negative.   Musculoskeletal: Negative.   Skin: Negative.   Allergic/Immunologic: Negative.   Neurological: Negative.   Hematological: Negative.   Psychiatric/Behavioral: Negative.   All other systems  reviewed and are negative.    Objective:    Vitals:   01/07/19 0946  BP: 130/62  Pulse: 66  Resp: 14  Temp: 97.8 F (36.6 C)  SpO2: 96%  Weight: 164 lb 14.4 oz (74.8 kg)  Height: 5\' 4"  (1.626 m)    Body mass index is 28.31 kg/m.  Physical Exam Vitals signs and nursing note reviewed.  Constitutional:      General: She is not in acute distress.    Appearance: Normal appearance. She is well-developed. She is not ill-appearing, toxic-appearing or diaphoretic.     Interventions: Face mask in place.  HENT:     Head: Normocephalic and atraumatic.     Right Ear: External ear normal.     Left Ear: External ear normal.  Eyes:     General: Lids are normal. No scleral icterus.       Right eye: No discharge.        Left eye: No discharge.     Conjunctiva/sclera: Conjunctivae normal.  Neck:     Musculoskeletal: Normal range of motion and neck supple.     Trachea: Phonation normal. No tracheal deviation.  Cardiovascular:     Rate and Rhythm: Normal rate and regular rhythm.     Pulses: Normal pulses.          Radial pulses are 2+ on the right side and 2+ on the left side.       Posterior tibial pulses are 2+ on the right side and 2+ on the left side.     Heart sounds: Normal heart sounds. No murmur.  No friction rub. No gallop.   Pulmonary:     Effort: Pulmonary effort is normal. No respiratory distress.     Breath sounds: Normal breath sounds. No stridor. No wheezing, rhonchi or rales.  Chest:     Chest wall: No tenderness.  Abdominal:     General: Bowel sounds are normal. There is no distension.     Palpations: Abdomen is soft.     Tenderness: There is no abdominal tenderness. There is no right CVA tenderness, left CVA tenderness, guarding or rebound.  Musculoskeletal: Normal range of motion.        General: No deformity.     Right lower leg: No edema.     Left lower leg: No edema.  Lymphadenopathy:     Cervical: No cervical adenopathy.  Skin:    General: Skin is warm  and dry.     Capillary Refill: Capillary refill takes less than 2 seconds.     Coloration: Skin is not jaundiced or pale.     Findings: No rash.  Neurological:     Mental Status: She is alert and oriented to person, place, and time.     Motor: No abnormal muscle tone.     Gait: Gait normal.  Psychiatric:        Mood and Affect: Mood normal.        Speech: Speech normal.        Behavior: Behavior normal.      Recent Results (from the past 2160 hour(s))  Urinalysis, Complete w Microscopic     Status: Abnormal   Collection Time: 01/02/19 11:46 AM  Result Value Ref Range   Color, Urine YELLOW YELLOW   APPearance CLOUDY (A) CLEAR   Specific Gravity, Urine 1.015 1.005 - 1.030   pH 5.0 5.0 - 8.0   Glucose, UA NEGATIVE NEGATIVE mg/dL   Hgb urine dipstick SMALL (A) NEGATIVE   Bilirubin Urine MODERATE (A) NEGATIVE   Ketones, ur TRACE (A) NEGATIVE mg/dL   Protein, ur 30 (A) NEGATIVE mg/dL   Nitrite NEGATIVE NEGATIVE   Leukocytes,Ua SMALL (A) NEGATIVE   Squamous Epithelial / LPF 0-5 0 - 5   WBC, UA >50 0 - 5 WBC/hpf   RBC / HPF 0-5 0 - 5 RBC/hpf   Bacteria, UA MANY (A) NONE SEEN   Granular Casts, UA PRESENT    WBC Casts, UA PRESENT     Comment: Performed at [facility], [address] [place], [place] [address]  Urine culture     Status: Abnormal   Collection Time: 01/02/19 11:46 AM   Specimen: Urine, Random  Result Value Ref Range   Specimen Description      URINE, RANDOM Performed at [facility]gent [facility], [address] [place], [place] [address]    Special Requests      NONE Performed at [facility], [address], [place], [place] [address]    Culture >=100,000 COLONIES/mL ESCHERICHIA COLI (A)    Report Status 01/04/2019 FINAL    Organism ID, Bacteria ESCHERICHIA COLI (A)       Susceptibility   Escherichia coli - MIC*    AMPICILLIN <=2 SENSITIVE Sensitive     CEFAZOLIN <=4 SENSITIVE Sensitive     CEFTRIAXONE <=1 SENSITIVE Sensitive      CIPROFLOXACIN <=0.25 SENSITIVE Sensitive     GENTAMICIN <=1 SENSITIVE Sensitive     IMIPENEM <=0.25 SENSITIVE Sensitive     NITROFURANTOIN <=16 SENSITIVE Sensitive     TRIMETH/SULFA <=20  SENSITIVE Sensitive     AMPICILLIN/SULBACTAM <=2 SENSITIVE Sensitive     PIP/TAZO <=4 SENSITIVE Sensitive     Extended ESBL NEGATIVE Sensitive     * >=100,000 COLONIES/mL ESCHERICHIA COLI    Diabetic Foot Exam: Diabetic Foot Exam - Simple   No data filed       PHQ2/9: Depression screen [facility] 2/9 01/07/2019 07/25/2018 01/28/2018 11/06/2016 03/28/2016  Decreased Interest 0 0 0 0 0  Down, Depressed, Hopeless 0 0 0 0 0  PHQ - 2 Score 0 0 0 0 0  Altered sleeping 0 0 0 - -  Tired, decreased energy 0 0 0 - -  Change in appetite 0 0 0 - -  Feeling bad or failure about yourself  0 0 0 - -  Trouble concentrating 0 0 0 - -  Moving slowly or fidgety/restless 0 0 0 - -  Suicidal thoughts 0 0 0 - -  PHQ-9 Score 0 0 0 - -  Difficult doing work/chores Not difficult at all Not difficult at all Not difficult at all - -    phq 9 is negative  Fall Risk: Fall Risk  01/07/2019 07/25/2018 01/28/2018 11/06/2016 03/28/2016  Falls in the past year? 0 0 0 No Yes  Number falls in past yr: 0 0 0 - 1  Injury with Fall? 0 0 0 - Yes      Functional Status Survey: Is the patient deaf or have difficulty hearing?: No Does the patient have difficulty seeing, even when wearing glasses/contacts?: No Does the patient have difficulty concentrating, remembering, or making decisions?: No Does the patient have difficulty walking or climbing stairs?: No Does the patient have difficulty dressing or bathing?: No Does the patient have difficulty doing errands alone such as visiting a doctor's office or shopping?: No    Assessment & Plan:     ICD-10-CM   1. Hypertension goal BP (blood pressure) < 140/90  I10 CMP w GFR    amLODipine (NORVASC) 10 MG tablet    benazepril (LOTENSIN) 20 MG tablet   at goal - check labs, meds  refilled  2. Mixed hyperlipidemia  E78.2 CMP w GFR    Lipid Panel   managed with lifestyle, no Rx meds, recheck labs  3. Urinary frequency  R35.0 POCT Urinalysis Dipstick    Urine Culture   recent UTI some residual vague urinary changes, recheck today  4. Vitamin D deficiency  E55.9 Vit D   on supplement, recheck  5. Impaired fasting glucose  R73.01 CMP w GFR    A1C   recheck A1C  6. Urinary hesitancy  R39.11   7. Urge incontinence of urine  N39.41    change after UTI - improved, but she is concerned she is retaining urine - may be some remaining inflammation, recheck urine, offered urology referral  8. Encounter for medication monitoring  Z51.81 CMP w GFR    Lipid Panel    CBC w/ Diff    Vit D  9. Right lower quadrant pain  R10.31    abdominal exam unremarkable, asked her to return when having sx to address, is not new, has been worked up before, intermittent      Return in about 6 months (around 07/08/2019) for Routine follow-up, follow up sooner if abdominal pain returns - acute visit for RLQ pain.   Delsa Grana, PA-C 01/07/19 9:54 AM

## 2019-01-08 LAB — HEMOGLOBIN A1C
Hgb A1c MFr Bld: 5.5 % of total Hgb (ref <5.7)
Mean Plasma Glucose: 111 (calc)
eAG (mmol/L): 6.2 (calc)

## 2019-01-08 LAB — COMPLETE METABOLIC PANEL WITH GFR
AG Ratio: 1.4 (calc) (ref 1.0–2.5)
ALT: 32 U/L — ABNORMAL HIGH (ref 6–29)
AST: 27 U/L (ref 10–35)
Albumin: 4.2 g/dL (ref 3.6–5.1)
Alkaline phosphatase (APISO): 78 U/L (ref 37–153)
BUN/Creatinine Ratio: 17 (calc) (ref 6–22)
BUN: 18 mg/dL (ref 7–25)
CO2: 21 mmol/L (ref 20–32)
Calcium: 9.7 mg/dL (ref 8.6–10.4)
Chloride: 104 mmol/L (ref 98–110)
Creat: 1.08 mg/dL — ABNORMAL HIGH (ref 0.60–0.88)
GFR, Est African American: 56 mL/min/{1.73_m2} — ABNORMAL LOW (ref >=60)
GFR, Est Non African American: 48 mL/min/{1.73_m2} — ABNORMAL LOW (ref >=60)
Globulin: 3.1 g/dL (calc) (ref 1.9–3.7)
Glucose, Bld: 95 mg/dL (ref 65–99)
Potassium: 4.7 mmol/L (ref 3.5–5.3)
Sodium: 138 mmol/L (ref 135–146)
Total Bilirubin: 0.3 mg/dL (ref 0.2–1.2)
Total Protein: 7.3 g/dL (ref 6.1–8.1)

## 2019-01-08 LAB — LIPID PANEL
Cholesterol: 271 mg/dL — ABNORMAL HIGH (ref <200)
HDL: 31 mg/dL — ABNORMAL LOW (ref >=50)
LDL Cholesterol (Calc): 194 mg/dL (calc) — ABNORMAL HIGH
Non-HDL Cholesterol (Calc): 240 mg/dL (calc) — ABNORMAL HIGH (ref <130)
Total CHOL/HDL Ratio: 8.7 (calc) — ABNORMAL HIGH (ref <5.0)
Triglycerides: 259 mg/dL — ABNORMAL HIGH (ref <150)

## 2019-01-08 LAB — CBC WITH DIFFERENTIAL/PLATELET
Absolute Monocytes: 516 cells/uL (ref 200–950)
Basophils Absolute: 93 cells/uL (ref 0–200)
Basophils Relative: 1.6 %
Eosinophils Absolute: 220 cells/uL (ref 15–500)
Eosinophils Relative: 3.8 %
HCT: 39.8 % (ref 35.0–45.0)
Hemoglobin: 13.1 g/dL (ref 11.7–15.5)
Lymphs Abs: 1189 cells/uL (ref 850–3900)
MCH: 28.9 pg (ref 27.0–33.0)
MCHC: 32.9 g/dL (ref 32.0–36.0)
MCV: 87.9 fL (ref 80.0–100.0)
MPV: 10.6 fL (ref 7.5–12.5)
Monocytes Relative: 8.9 %
Neutro Abs: 3782 cells/uL (ref 1500–7800)
Neutrophils Relative %: 65.2 %
Platelets: 383 10*3/uL (ref 140–400)
RBC: 4.53 10*6/uL (ref 3.80–5.10)
RDW: 12.9 % (ref 11.0–15.0)
Total Lymphocyte: 20.5 %
WBC: 5.8 10*3/uL (ref 3.8–10.8)

## 2019-01-08 LAB — VITAMIN D 25 HYDROXY (VIT D DEFICIENCY, FRACTURES): Vit D, 25-Hydroxy: 37 ng/mL (ref 30–100)

## 2019-01-09 LAB — URINE CULTURE
MICRO NUMBER:: 1181909
SPECIMEN QUALITY:: ADEQUATE

## 2019-01-13 ENCOUNTER — Encounter: Payer: Self-pay | Admitting: Family Medicine

## 2019-01-29 ENCOUNTER — Ambulatory Visit: Payer: Medicare HMO | Admitting: Family Medicine

## 2019-04-26 ENCOUNTER — Ambulatory Visit: Payer: Medicare HMO

## 2019-04-28 ENCOUNTER — Ambulatory Visit: Payer: Medicare HMO | Attending: Internal Medicine

## 2019-04-28 DIAGNOSIS — Z23 Encounter for immunization: Secondary | ICD-10-CM

## 2019-04-28 NOTE — Progress Notes (Signed)
   Covid-19 Vaccination Clinic  Name:  Michelle Blackwell    MRN: [id number] DOB: 1935-07-15  04/28/2019  [name] was observed post Covid-19 immunization for 15 minutes without incident. She was provided with Vaccine Information Sheet and instruction to access the V-Safe system.   [name] was instructed to call 911 with any severe reactions post vaccine: Marland Kitchen Difficulty breathing  . Swelling of face and throat  . A fast heartbeat  . A bad rash all over body  . Dizziness and weakness   Immunizations Administered    Name Date Dose VIS Date Route   Pfizer COVID-19 Vaccine 04/28/2019 12:25 PM 0.3 mL 01/09/2019 Intramuscular   Manufacturer: [place]   Lot: [id number]   [place]: [id number]

## 2019-05-14 DIAGNOSIS — I10 Essential (primary) hypertension: Secondary | ICD-10-CM | POA: Diagnosis not present

## 2019-05-14 DIAGNOSIS — Z0000 Encounter for general adult medical examination without abnormal findings: Secondary | ICD-10-CM | POA: Diagnosis not present

## 2019-05-14 DIAGNOSIS — M79605 Pain in left leg: Secondary | ICD-10-CM | POA: Diagnosis not present

## 2019-05-14 DIAGNOSIS — Z13220 Encounter for screening for lipoid disorders: Secondary | ICD-10-CM | POA: Diagnosis not present

## 2019-05-21 DIAGNOSIS — M79605 Pain in left leg: Secondary | ICD-10-CM | POA: Diagnosis not present

## 2019-05-21 DIAGNOSIS — M25552 Pain in left hip: Secondary | ICD-10-CM | POA: Diagnosis not present

## 2019-05-21 DIAGNOSIS — E782 Mixed hyperlipidemia: Secondary | ICD-10-CM | POA: Diagnosis not present

## 2019-05-21 DIAGNOSIS — I10 Essential (primary) hypertension: Secondary | ICD-10-CM | POA: Diagnosis not present

## 2019-05-25 DIAGNOSIS — M7062 Trochanteric bursitis, left hip: Secondary | ICD-10-CM | POA: Diagnosis not present

## 2019-07-03 ENCOUNTER — Ambulatory Visit (INDEPENDENT_AMBULATORY_CARE_PROVIDER_SITE_OTHER): Payer: Medicare HMO

## 2019-07-03 ENCOUNTER — Encounter: Payer: Self-pay | Admitting: Orthopedic Surgery

## 2019-07-03 ENCOUNTER — Ambulatory Visit: Payer: Medicare HMO | Admitting: Orthopedic Surgery

## 2019-07-03 DIAGNOSIS — M25561 Pain in right knee: Secondary | ICD-10-CM | POA: Diagnosis not present

## 2019-07-03 DIAGNOSIS — G8929 Other chronic pain: Secondary | ICD-10-CM

## 2019-07-03 NOTE — Progress Notes (Signed)
Office Visit Note   Patient: Michelle Blackwell           Date of Birth: Jan 02, 1936           MRN: [id number] Visit Date: 07/03/2019 Requested by: Delsa Grana, PA-C [address] [place],  [place] [address] PCP: Delsa Grana, PA-C  Subjective: Chief Complaint  Patient presents with  . 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  She states that "I not have pain in my knee but my leg is not straight".  She reports some swelling and weakness at times.  She does use a cane.  She is concerned that her knee is causing more issues with her left hip.  She does take oral hyaluronic acid which she credits for her relative lack of symptoms.  She does not want any injections.              ROS: All systems reviewed are negative as they relate to the chief complaint within the history of present illness.  Patient denies  fevers or chills.   Assessment & Plan: Visit Diagnoses:  1. Chronic pain of right knee     Plan: Impression is end-stage right knee arthritis with valgus alignment which is relatively asymptomatic for the patient.  She is wondering if she should undergo knee replacement just to straighten her knee.  I think we could straighten her knee with knee replacement but would not recommend that at this time based on her lack of symptoms.  If she does become more symptomatic then we should discuss both operative and nonoperative options.  For now she was interested in reassurance that she does not need a major knee replacement surgery which I do not think she does at this time.  Follow-Up Instructions: Return if symptoms worsen or fail to improve.   Orders:  Orders Placed This Encounter  Procedures  . XR KNEE 3 VIEW RIGHT   No orders of the defined types were placed in this encounter.     Procedures: No procedures performed   Clinical Data: No additional findings.  Objective: Vital Signs: There were no vitals taken for this  visit.  Physical Exam:   Constitutional: Patient appears well-developed HEENT:  Head: Normocephalic Eyes:EOM are normal Neck: Normal range of motion Cardiovascular: Normal rate Pulmonary/chest: Effort normal Neurologic: Patient is alert Skin: Skin is warm Psychiatric: Patient has normal mood and affect    Ortho Exam: Ortho exam demonstrates valgus alignment right lower extremity but pedal pulses are palpable.  The deformity in her right leg is fairly fixed and so I do not think bracing would be beneficial.  Extensor mechanism is intact.  No groin pain with internal X rotation of either leg.  She has full extension and flexion past 90.  No paresthesias dorsal aspect of the foot.  Specialty Comments:  No specialty comments available.  Imaging: XR KNEE 3 VIEW RIGHT  Result Date: 07/03/2019 AP lateral merchant right knee reviewed.  Severe tricompartmental arthritis is present with valgus alignment.  Arthritis is worse in the lateral compartment and patellofemoral compartment.  No acute fracture.  Osteopenia present.    PMFS History: Patient Active Problem List   Diagnosis Date Noted  . Arthritis 10/12/2014  . Hypertension goal BP (blood pressure) < 140/90 10/12/2014  . Right knee pain 10/12/2014  . Genu varus 10/12/2014  . Mixed hyperlipidemia 12/16/2010  . Vertigo 12/16/2010  . Vitamin D deficiency 12/16/2010  .  Impaired fasting glucose 12/16/2010  . Benign essential hypertension 09/15/1993   Past Medical History:  Diagnosis Date  . Allergy   . Anemia   . Arthritis   . Hyperlipidemia   . Hypertension     Family History  Problem Relation Age of Onset  . Cancer Mother        lung cancer; heavy smoker for years  . Heart disease Mother        congestive heart failure  . Emphysema Father   . Heart disease Brother        not sure, may have had sudden cardiac death  . Hyperlipidemia Sister     Past Surgical History:  Procedure Laterality Date  . ABDOMINAL  HYSTERECTOMY    . CATARACT EXTRACTION Left 04/29/2016   Social History   Occupational History  . Not on file  Tobacco Use  . Smoking status: Never Smoker  . Smokeless tobacco: Never Used  Substance and Sexual Activity  . Alcohol use: Yes    Alcohol/week: 0.0 standard drinks    Comment: occasional   . Drug use: No  . Sexual activity: Never

## 2019-07-08 ENCOUNTER — Ambulatory Visit: Payer: Medicare HMO | Admitting: Family Medicine

## 2019-08-24 DIAGNOSIS — H5201 Hypermetropia, right eye: Secondary | ICD-10-CM | POA: Diagnosis not present

## 2019-08-24 DIAGNOSIS — H04129 Dry eye syndrome of unspecified lacrimal gland: Secondary | ICD-10-CM | POA: Diagnosis not present

## 2019-08-24 DIAGNOSIS — H2513 Age-related nuclear cataract, bilateral: Secondary | ICD-10-CM | POA: Diagnosis not present

## 2019-08-24 DIAGNOSIS — H26492 Other secondary cataract, left eye: Secondary | ICD-10-CM | POA: Diagnosis not present

## 2019-08-24 DIAGNOSIS — H40013 Open angle with borderline findings, low risk, bilateral: Secondary | ICD-10-CM | POA: Diagnosis not present

## 2019-08-24 DIAGNOSIS — Z0100 Encounter for examination of eyes and vision without abnormal findings: Secondary | ICD-10-CM | POA: Diagnosis not present

## 2019-10-13 DIAGNOSIS — Z0000 Encounter for general adult medical examination without abnormal findings: Secondary | ICD-10-CM | POA: Diagnosis not present

## 2019-10-13 DIAGNOSIS — E7800 Pure hypercholesterolemia, unspecified: Secondary | ICD-10-CM | POA: Diagnosis not present

## 2019-10-13 DIAGNOSIS — Z23 Encounter for immunization: Secondary | ICD-10-CM | POA: Diagnosis not present

## 2019-10-13 DIAGNOSIS — I10 Essential (primary) hypertension: Secondary | ICD-10-CM | POA: Diagnosis not present

## 2020-02-22 ENCOUNTER — Other Ambulatory Visit: Payer: Self-pay | Admitting: Family Medicine

## 2020-02-22 DIAGNOSIS — I10 Essential (primary) hypertension: Secondary | ICD-10-CM

## 2020-02-22 NOTE — Telephone Encounter (Signed)
Lvm that she will need a face to face visit in the office for med refill. Has not been here since 12-2018 per doctor

## 2020-02-22 NOTE — Telephone Encounter (Signed)
Pt needs a visit before she can get refill on bp med its been over a yr since seen

## 2020-02-24 ENCOUNTER — Emergency Department: Payer: Medicare HMO

## 2020-02-24 ENCOUNTER — Encounter: Payer: Self-pay | Admitting: Emergency Medicine

## 2020-02-24 ENCOUNTER — Emergency Department
Admission: EM | Admit: 2020-02-24 | Discharge: 2020-02-26 | Disposition: A | Discharge status: Home / Self Care | Payer: Medicare HMO | Attending: Student in an Organized Health Care Education/Training Program | Admitting: Student in an Organized Health Care Education/Training Program

## 2020-02-24 ENCOUNTER — Other Ambulatory Visit: Payer: Self-pay

## 2020-02-24 DIAGNOSIS — M546 Pain in thoracic spine: Secondary | ICD-10-CM | POA: Diagnosis present

## 2020-02-24 DIAGNOSIS — R079 Chest pain, unspecified: Secondary | ICD-10-CM | POA: Diagnosis not present

## 2020-02-24 DIAGNOSIS — Z5321 Procedure and treatment not carried out due to patient leaving prior to being seen by health care provider: Secondary | ICD-10-CM | POA: Insufficient documentation

## 2020-02-24 LAB — BASIC METABOLIC PANEL
Anion gap: 11 (ref 5–15)
BUN: 24 mg/dL — ABNORMAL HIGH (ref 8–23)
CO2: 29 mmol/L (ref 22–32)
Calcium: 9.6 mg/dL (ref 8.9–10.3)
Chloride: 99 mmol/L (ref 98–111)
Creatinine, Ser: 0.91 mg/dL (ref 0.44–1.00)
GFR, Estimated: >60 mL/min (ref >60)
Glucose, Bld: 120 mg/dL — ABNORMAL HIGH (ref 70–99)
Potassium: 4.6 mmol/L (ref 3.5–5.1)
Sodium: 139 mmol/L (ref 135–145)

## 2020-02-24 LAB — CBC
HCT: 44.8 % (ref 36.0–46.0)
Hemoglobin: 14.6 g/dL (ref 12.0–15.0)
MCH: 29.3 pg (ref 26.0–34.0)
MCHC: 32.6 g/dL (ref 30.0–36.0)
MCV: 89.8 fL (ref 80.0–100.0)
Platelets: 367 10*3/uL (ref 150–400)
RBC: 4.99 MIL/uL (ref 3.87–5.11)
RDW: 12.7 % (ref 11.5–15.5)
WBC: 6.8 10*3/uL (ref 4.0–10.5)
nRBC: 0 % (ref 0.0–0.2)

## 2020-02-24 LAB — TROPONIN I (HIGH SENSITIVITY): Troponin I (High Sensitivity): 5 ng/L (ref <18)

## 2020-02-24 NOTE — ED Triage Notes (Signed)
Pt states upper mid back and "solid" feeling cp under her left breast began last night, very unusual for her pt states, pain returned today while she was shopping, took a baby ASA and came here. Both instances lasted around 10 minutes. Denies pain at this time. Alert and oriented x4. NAD.

## 2020-03-20 IMAGING — US US PELVIS COMPLETE
1 series · 14 of 25 positions shown · non-contrast
Comparison: None

CLINICAL DATA: Intermittent RIGHT lower quadrant and pelvic pain
chronically for more than 1 year

EXAM:
TRANSABDOMINAL ULTRASOUND OF PELVIS
TECHNIQUE: Transabdominal ultrasound examination of the pelvis was performed
including evaluation of the uterus, ovaries, adnexal regions, and
pelvic cul-de-sac. Patient declined transvaginal imaging.

[Series 1: us pelvis complete · 0.23mm/px · 14 of 92 slices shown]
[im 1/92]
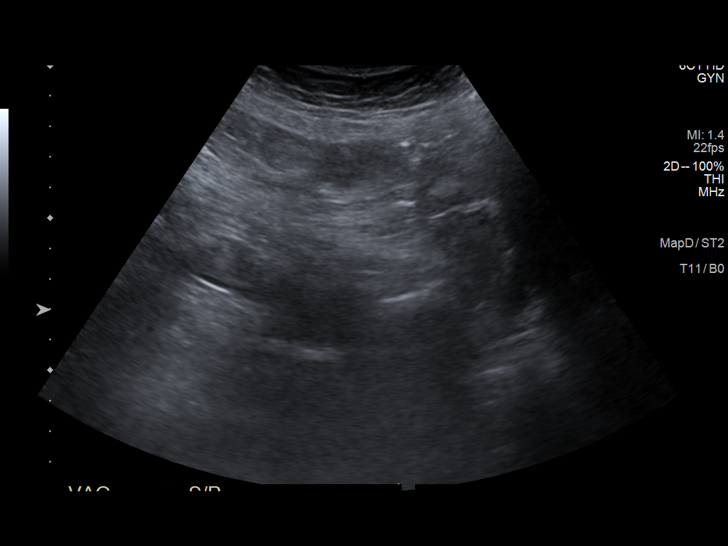
[im 8/92]
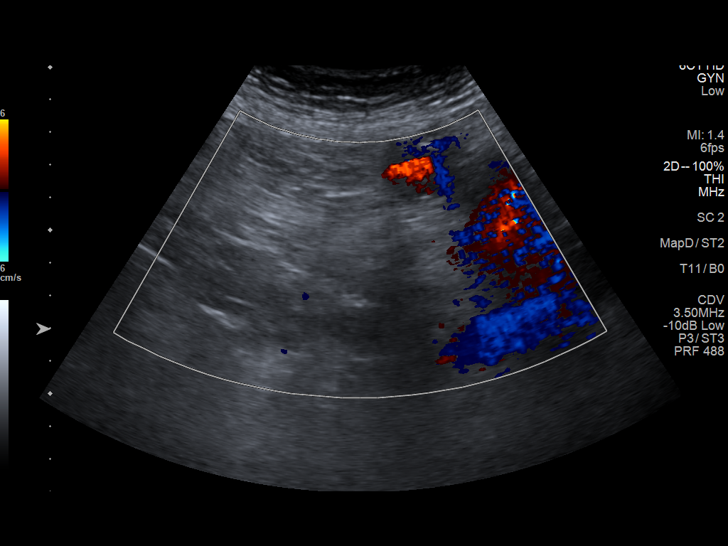
[im 16/92]
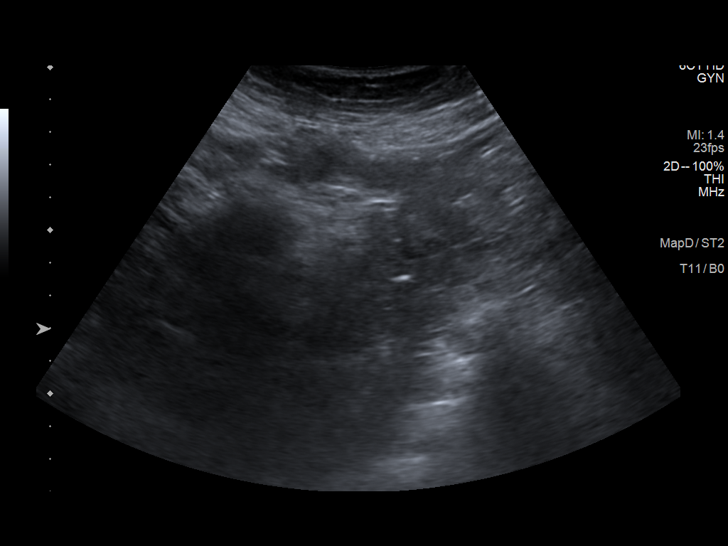
[im 23/92]
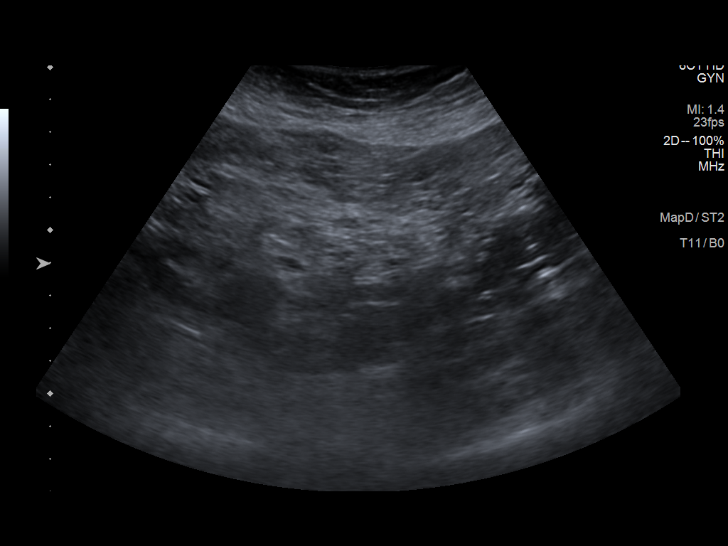
[im 31/92]
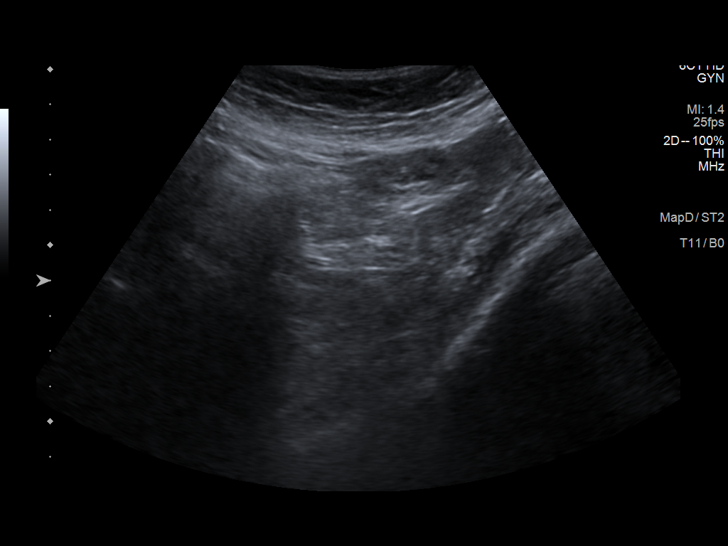
[im 35/92]
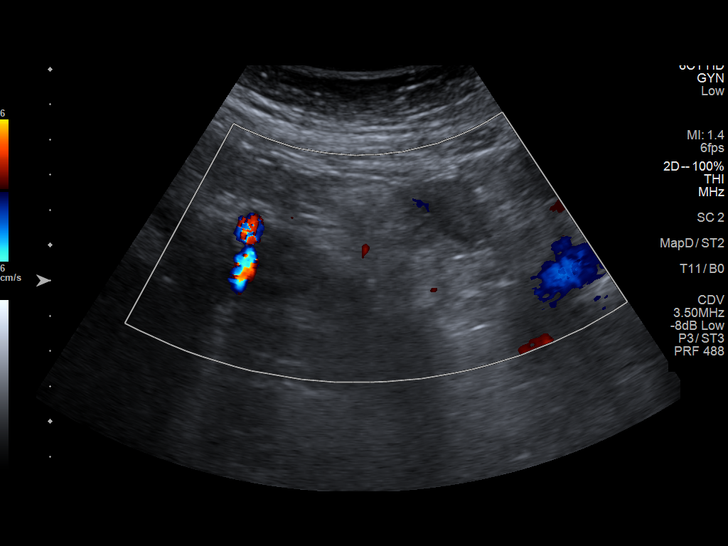
[im 42/92]
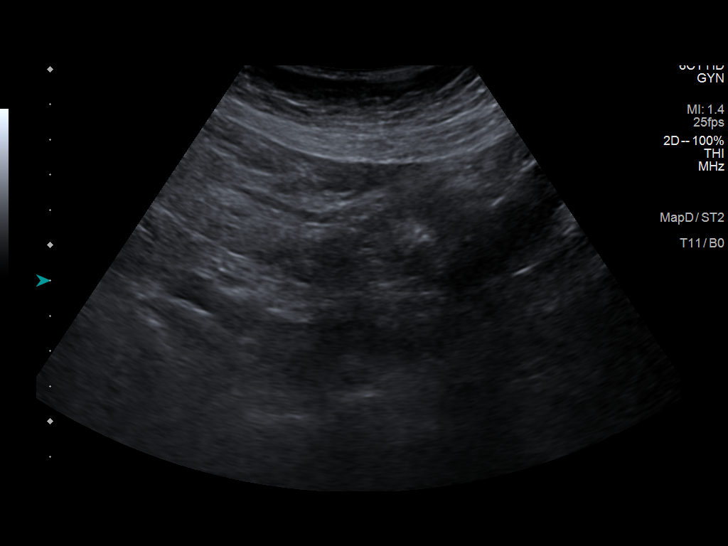
[im 50/92]
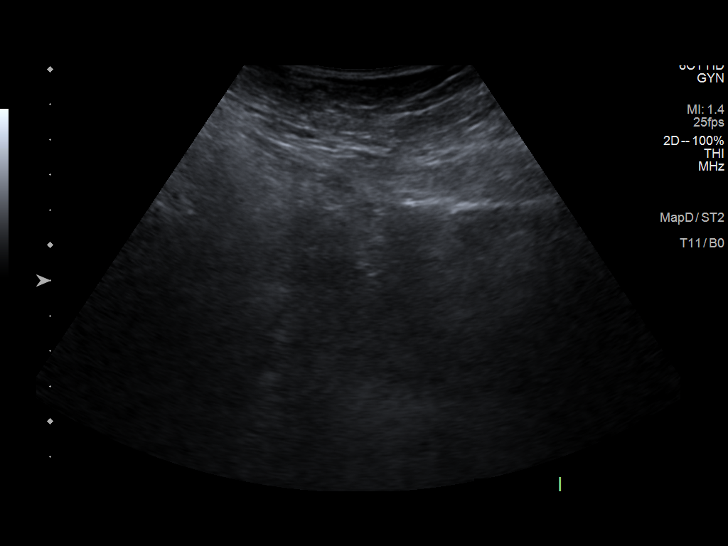
[im 57/92]
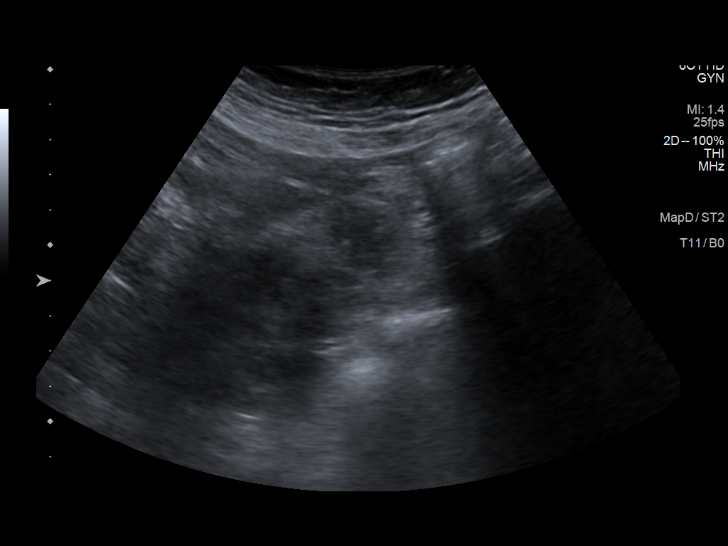
[im 61/92]
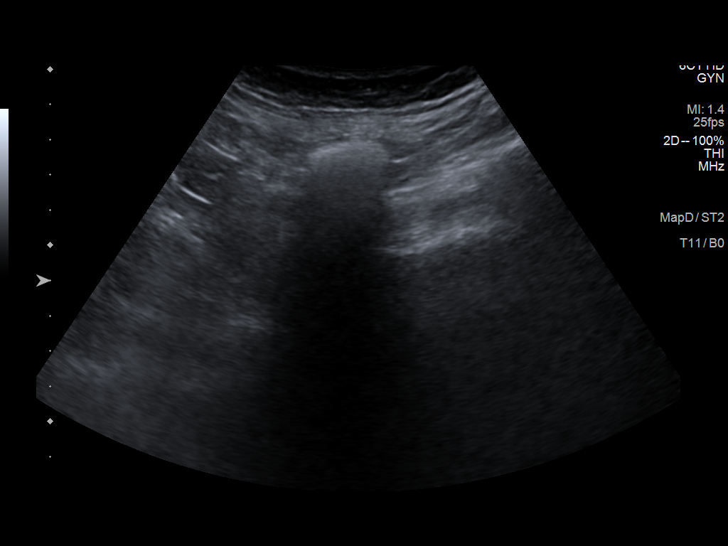
[im 69/92]
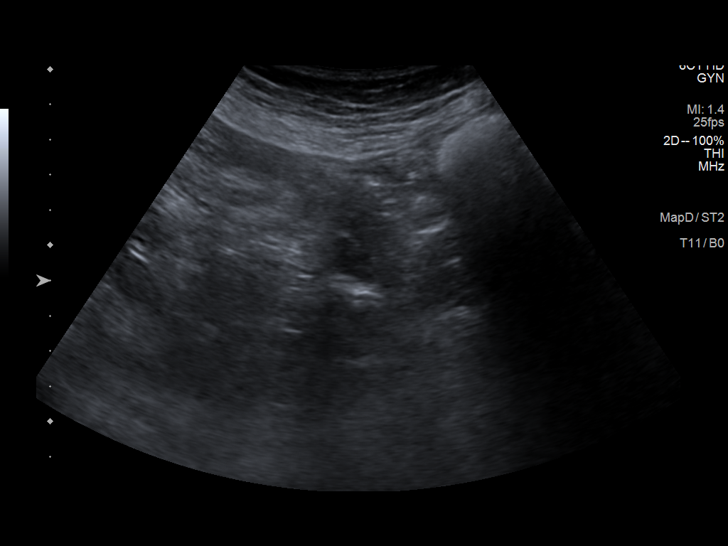
[im 76/92]
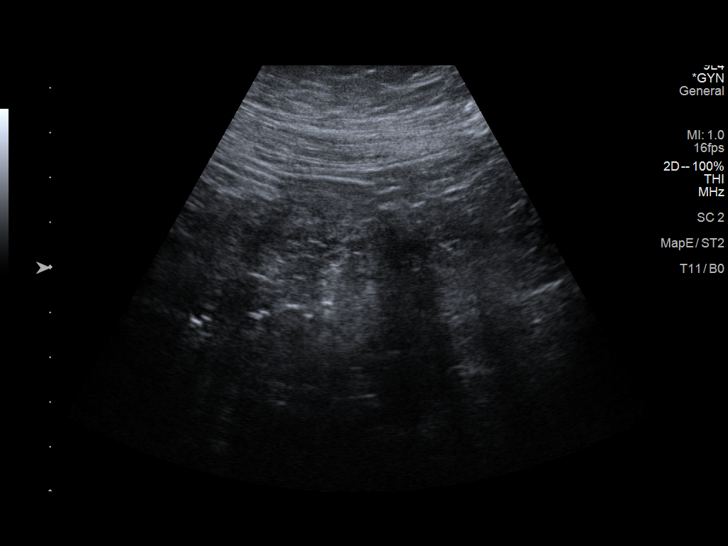
[im 84/92]
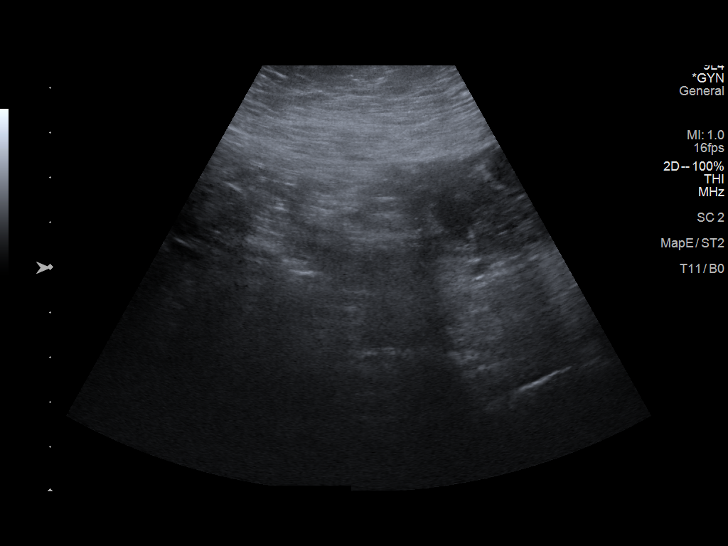
[im 92/92]
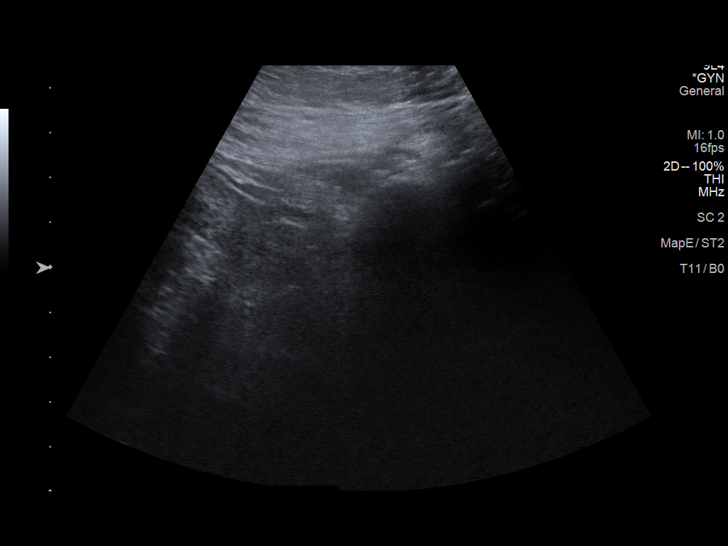

[14 of 25 positions shown; findings below may reference images not displayed]

FINDINGS: Uterus

Surgically absent

Endometrium

N/A

Right ovary

Not visualized on either transabdominal or endovaginal imaging,
question surgically absent versus obscured by bowel

Left ovary

Not visualized on either transabdominal or endovaginal imaging,
question surgically absent versus obscured by bowel

Other findings:  No free pelvic fluid.  No adnexal masses.
IMPRESSION: Post hysterectomy with nonvisualization of ovaries.

No pelvic sonographic abnormalities identified.

Patient declined transvaginal imaging.

## 2021-08-28 IMAGING — CR DG CHEST 2V
1 series · 2 of 2 positions shown · non-contrast
Comparison: None.

CLINICAL DATA: Mid upper back pain

EXAM:
CHEST - 2 VIEW

[Series 1: dg chest 2 view · 0.14mm/px · 2 of 2 slices shown]
[im 1/2]
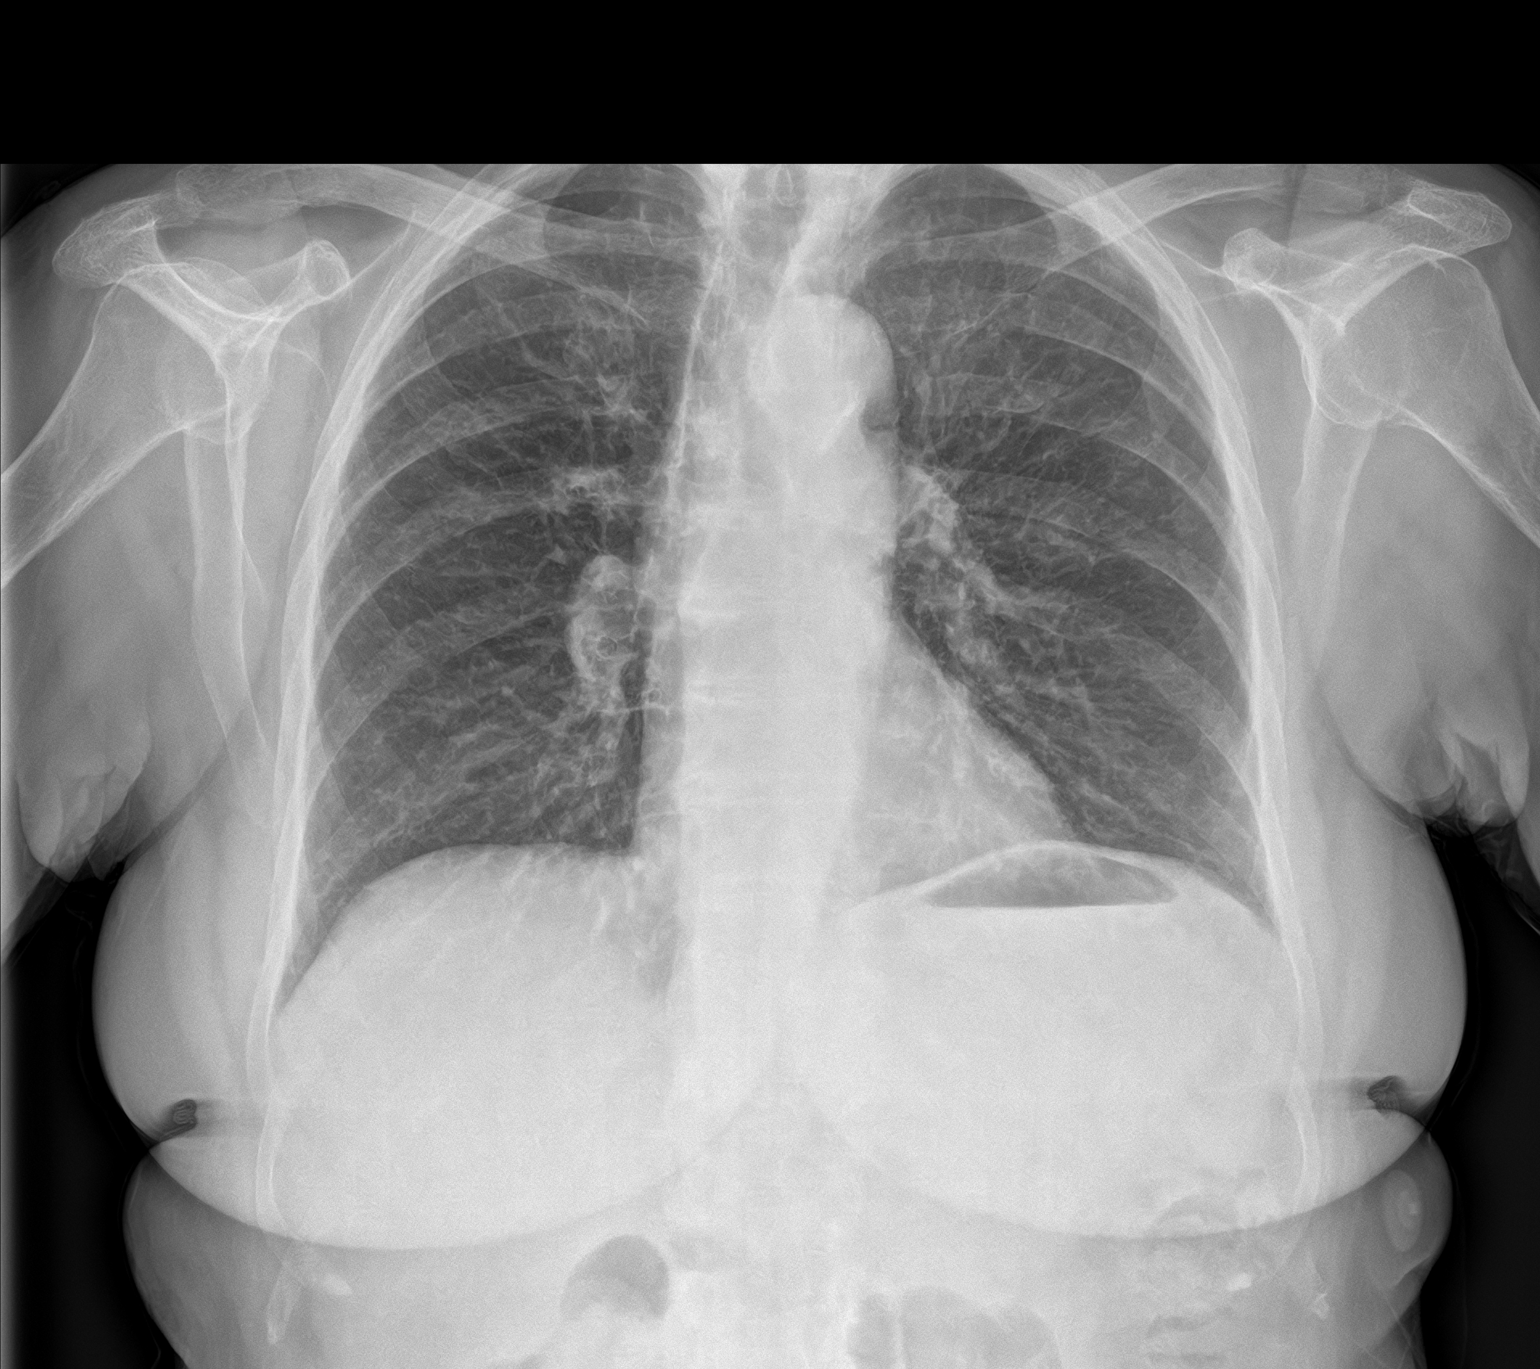
[im 2/2]
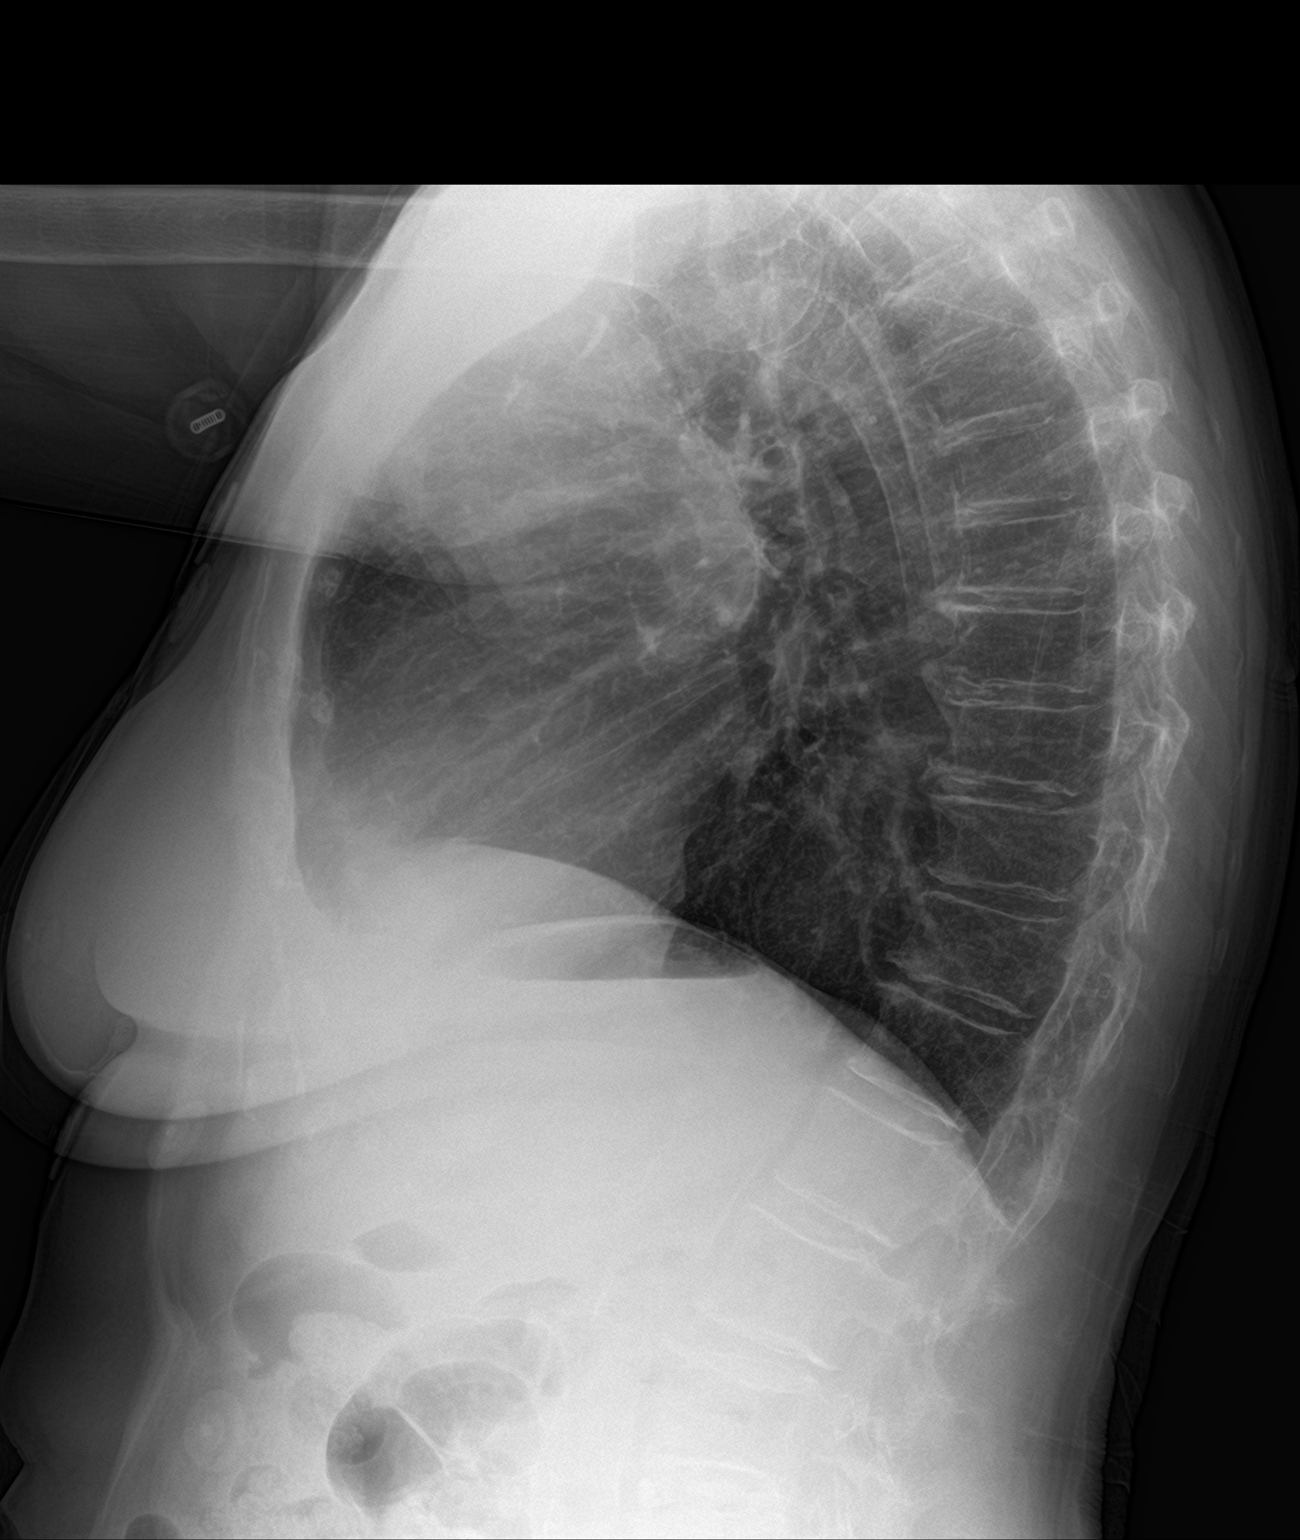

[2 of 2 positions shown; findings below may reference images not displayed]

FINDINGS: The heart size and mediastinal contours are within normal limits.
Both lungs are clear. The visualized skeletal structures are
unremarkable.
IMPRESSION: No active cardiopulmonary disease.

## 2021-10-13 ENCOUNTER — Ambulatory Visit (INDEPENDENT_AMBULATORY_CARE_PROVIDER_SITE_OTHER): Payer: Medicare HMO

## 2021-10-13 ENCOUNTER — Ambulatory Visit: Payer: Medicare HMO | Admitting: Surgical

## 2021-10-13 DIAGNOSIS — M25562 Pain in left knee: Secondary | ICD-10-CM

## 2021-10-13 DIAGNOSIS — M170 Bilateral primary osteoarthritis of knee: Secondary | ICD-10-CM | POA: Diagnosis not present

## 2021-10-14 ENCOUNTER — Encounter: Payer: Self-pay | Admitting: Surgical

## 2021-10-14 NOTE — Progress Notes (Signed)
Office Visit Note   Patient: Michelle Blackwell           Date of Birth: Oct 29, 1935           MRN: [id number] Visit Date: 10/13/2021 Requested by: Delsa Grana, PA-C [address] [place],  [place] [address] PCP: Delsa Grana, PA-C  Subjective: Chief Complaint  Patient presents with   Left Knee - Pain    HPI: Sejal Cofield is a 86 y.o. female who presents to the office complaining of left knee pain.  Has had some increased left knee pain without any injury.  More uncomfortable with crossing her leg.  She will have intermittent pain with some days not really giving her any difficulty and then several days in a row discomfort.  Localizes pain to the lateral left the left knee.  She also has a history of right knee arthritis that she has seen Dr. Marlou Sa for about 2 years ago.  No real change in the right knee.  Does not really have any pain.  She takes herbs and uses a combination of herbs to help with pain and abnormalities that she has.  Proxy avoid taking medications.  Does take hyaluronic acid every day to help with arthritic pain and states that this helps with the bursitis she has had in both of her trochanters for several years.  Feels a little bit of stiffness behind her knee at times.  Pain is worse with knee flexion.  No fevers..                ROS: All systems reviewed are negative as they relate to the chief complaint within the history of present illness.  Patient denies fevers or chills.  Assessment & Plan: Visit Diagnoses:  1. Bilateral primary osteoarthritis of knee   2. Left knee pain, unspecified chronicity     Plan: Patient is a 86 year old female who presents for evaluation of left knee pain.  She has history of left knee and right knee osteoarthritis though the left knee has not really bothered her until recently.  Right knee is much more severe than her left knee on radiographs but this is also not giving her any difficulty.  With her lateral compartment arthritis in  the left knee, discussed the options available to patient including aspiration and injection of cortisone today versus hyaluronic acid injection in the future versus home exercise program devoted to mild loadbearing quadricep strengthening exercises.  After discussion, she would like to try home exercise program.  We will preapproved her for bilateral knee gel injections so that if she has continued pain, she has the option to come into the office to try the hyaluronic acid injections.  Patient agreed with this plan.  Follow-up as needed.  This patient is diagnosed with osteoarthritis of the knee(s).    Radiographs show evidence of joint space narrowing, osteophytes, subchondral sclerosis and/or subchondral cysts.  This patient has knee pain which interferes with functional and activities of daily living.    This patient has experienced inadequate response, adverse effects and/or intolerance with conservative treatments such as acetaminophen, NSAIDS, topical creams, physical therapy or regular exercise, knee bracing and/or weight loss.   This patient has experienced inadequate response or has a contraindication to intra articular steroid injections for at least 3 months.   This patient is not scheduled to have a total knee replacement within 6 months of starting treatment with viscosupplementation.   Follow-Up Instructions: No follow-ups on file.   Orders:  Orders Placed This Encounter  Procedures   XR KNEE 3 VIEW LEFT   No orders of the defined types were placed in this encounter.     Procedures: No procedures performed   Clinical Data: No additional findings.  Objective: Vital Signs: There were no vitals taken for this visit.  Physical Exam:  Constitutional: Patient appears well-developed HEENT:  Head: Normocephalic Eyes:EOM are normal Neck: Normal range of motion Cardiovascular: Normal rate Pulmonary/chest: Effort normal Neurologic: Patient is alert Skin: Skin is  warm Psychiatric: Patient has normal mood and affect  Ortho Exam: Ortho exam demonstrates left knee with small effusion.  Tenderness over the lateral joint line mildly.  No tenderness over the medial joint line.  No calf tenderness.  Negative Homans' sign.  She has range of motion from about 3 degrees extension to 110 degrees of knee flexion.  No pain with hip range of motion.  She is able to perform straight leg raise without extensor lag.  Excellent quad strength.  No instability to varus or valgus stress at 0 30 degrees.  Stable to anterior posterior drawer.  Specialty Comments:  No specialty comments available.  Imaging: No results found.   PMFS History: Patient Active Problem List   Diagnosis Date Noted   Arthritis 10/12/2014   Hypertension goal BP (blood pressure) < 140/90 10/12/2014   Right knee pain 10/12/2014   Genu varus 10/12/2014   Mixed hyperlipidemia 12/16/2010   Vertigo 12/16/2010   Vitamin D deficiency 12/16/2010   Impaired fasting glucose 12/16/2010   Benign essential hypertension 09/15/1993   Past Medical History:  Diagnosis Date   Allergy    Anemia    Arthritis    Hyperlipidemia    Hypertension     Family History  Problem Relation Age of Onset   Cancer Mother        lung cancer; heavy smoker for years   Heart disease Mother        congestive heart failure   Emphysema Father    Heart disease Brother        not sure, may have had sudden cardiac death   Hyperlipidemia Sister     Past Surgical History:  Procedure Laterality Date   ABDOMINAL HYSTERECTOMY     CATARACT EXTRACTION Left 04/29/2016   Social History   Occupational History   Not on file  Tobacco Use   Smoking status: Never   Smokeless tobacco: Never  Vaping Use   Vaping Use: Never used  Substance and Sexual Activity   Alcohol use: Yes    Alcohol/week: 0.0 standard drinks of alcohol    Comment: occasional    Drug use: No   Sexual activity: Never

## 2021-10-26 ENCOUNTER — Telehealth: Payer: Self-pay

## 2021-10-26 NOTE — Telephone Encounter (Signed)
Can we get patient approved for Bil knee injections ?

## 2021-10-31 NOTE — Telephone Encounter (Signed)
VOB submitted for Monovisc, bilateral knee  

## 2022-02-27 DIAGNOSIS — E559 Vitamin D deficiency, unspecified: Secondary | ICD-10-CM | POA: Diagnosis not present

## 2022-02-27 DIAGNOSIS — R739 Hyperglycemia, unspecified: Secondary | ICD-10-CM | POA: Diagnosis not present

## 2022-02-27 DIAGNOSIS — E785 Hyperlipidemia, unspecified: Secondary | ICD-10-CM | POA: Diagnosis not present

## 2022-02-27 DIAGNOSIS — I10 Essential (primary) hypertension: Secondary | ICD-10-CM | POA: Diagnosis not present

## 2022-02-27 DIAGNOSIS — R5383 Other fatigue: Secondary | ICD-10-CM | POA: Diagnosis not present

## 2022-02-27 DIAGNOSIS — Z130 Encounter for screening for diseases of the blood and blood-forming organs and certain disorders involving the immune mechanism: Secondary | ICD-10-CM | POA: Diagnosis not present

## 2022-02-27 DIAGNOSIS — R42 Dizziness and giddiness: Secondary | ICD-10-CM | POA: Diagnosis not present

## 2022-02-27 DIAGNOSIS — R9431 Abnormal electrocardiogram [ECG] [EKG]: Secondary | ICD-10-CM | POA: Diagnosis not present

## 2022-04-06 ENCOUNTER — Ambulatory Visit: Payer: Medicare HMO | Admitting: Physician Assistant

## 2022-04-10 ENCOUNTER — Encounter: Payer: Self-pay | Admitting: Emergency Medicine

## 2022-04-10 ENCOUNTER — Emergency Department: Payer: HMO

## 2022-04-10 ENCOUNTER — Emergency Department
Admission: EM | Admit: 2022-04-10 | Discharge: 2022-04-10 | Disposition: A | Discharge status: Home / Self Care | Payer: HMO | Attending: Student in an Organized Health Care Education/Training Program | Admitting: Student in an Organized Health Care Education/Training Program

## 2022-04-10 ENCOUNTER — Other Ambulatory Visit: Payer: Self-pay

## 2022-04-10 DIAGNOSIS — Y92009 Unspecified place in unspecified non-institutional (private) residence as the place of occurrence of the external cause: Secondary | ICD-10-CM | POA: Insufficient documentation

## 2022-04-10 DIAGNOSIS — W19XXXA Unspecified fall, initial encounter: Secondary | ICD-10-CM | POA: Diagnosis not present

## 2022-04-10 DIAGNOSIS — W010XXA Fall on same level from slipping, tripping and stumbling without subsequent striking against object, initial encounter: Secondary | ICD-10-CM | POA: Diagnosis not present

## 2022-04-10 DIAGNOSIS — S42202A Unspecified fracture of upper end of left humerus, initial encounter for closed fracture: Secondary | ICD-10-CM | POA: Insufficient documentation

## 2022-04-10 DIAGNOSIS — M47812 Spondylosis without myelopathy or radiculopathy, cervical region: Secondary | ICD-10-CM | POA: Diagnosis not present

## 2022-04-10 DIAGNOSIS — S40912A Unspecified superficial injury of left shoulder, initial encounter: Secondary | ICD-10-CM | POA: Diagnosis present

## 2022-04-10 DIAGNOSIS — S0990XA Unspecified injury of head, initial encounter: Secondary | ICD-10-CM | POA: Diagnosis not present

## 2022-04-10 DIAGNOSIS — T07XXXA Unspecified multiple injuries, initial encounter: Secondary | ICD-10-CM

## 2022-04-10 DIAGNOSIS — M79632 Pain in left forearm: Secondary | ICD-10-CM | POA: Diagnosis not present

## 2022-04-10 DIAGNOSIS — I959 Hypotension, unspecified: Secondary | ICD-10-CM | POA: Diagnosis not present

## 2022-04-10 DIAGNOSIS — S4982XA Other specified injuries of left shoulder and upper arm, initial encounter: Secondary | ICD-10-CM | POA: Diagnosis not present

## 2022-04-10 DIAGNOSIS — M25512 Pain in left shoulder: Secondary | ICD-10-CM | POA: Diagnosis not present

## 2022-04-10 MED ORDER — MORPHINE SULFATE (PF) 2 MG/ML IV SOLN
2.0000 mg | Freq: Once | INTRAVENOUS | Status: AC
  Administered 2022-04-10: 2 mg via INTRAMUSCULAR
  Filled 2022-04-10: qty 1

## 2022-04-10 MED ORDER — FENTANYL CITRATE PF 50 MCG/ML IJ SOSY: 12.5000 ug | PREFILLED_SYRINGE | Freq: Once | INTRAMUSCULAR | Status: DC

## 2022-04-10 MED ORDER — ACETAMINOPHEN 500 MG PO TABS
500.0000 mg | ORAL_TABLET | Freq: Once | ORAL | Status: AC
  Administered 2022-04-10: 500 mg via ORAL
  Filled 2022-04-10: qty 1

## 2022-04-10 MED ORDER — HYDROCODONE-ACETAMINOPHEN 5-325 MG PO TABS: 1.0000 | ORAL_TABLET | Freq: Three times a day (TID) | ORAL | 0 refills | Status: AC

## 2022-04-10 NOTE — Discharge Instructions (Signed)
Call make an appointment with Dr. Roland Rack who is on-call for orthopedics.  His office information is listed on your discharge papers.  Wear the sling for support and immobilization.  You may apply ice to your shoulder if needed for pain and for swelling.  A prescription for hydrocodone was sent to the pharmacy to take for pain.  Be aware that this medication could cause drowsiness and increase your risk for injury.

## 2022-04-10 NOTE — ED Triage Notes (Signed)
Presents via EMS s/p fall  States she tripped over ledge  landed on left shoulder Possible deformity noted

## 2022-04-10 NOTE — ED Provider Notes (Signed)
[facility] Provider Note    Event Date/Time   First MD Initiated Contact with Patient 04/10/22 1243     (approximate)   History   Fall   HPI  Sutten Junes is a 87 y.o. female   presents to the ED via EMS after a fall at her home in which she states she tripped over a piece of flooring in her home and landed on her left shoulder.  Patient denies any loss of consciousness, nausea, vomiting, dizziness or visual changes.  She has had difficulty moving her left upper extremity due to pain.      Physical Exam   Triage Vital Signs: ED Triage Vitals  Enc Vitals Group     BP 04/10/22 1249 (!) 143/71     Pulse Rate 04/10/22 1249 74     Resp 04/10/22 1249 17     Temp 04/10/22 1249 98.7 F (37.1 C)     Temp Source 04/10/22 1249 Oral     SpO2 04/10/22 1249 100 %     Weight 04/10/22 1248 150 lb (68 kg)     Height 04/10/22 1248 '5\' 6"'$  (1.676 m)     Head Circumference --      Peak Flow --      Pain Score 04/10/22 1248 8     Pain Loc --      Pain Edu? --      Excl. in [place]? --     Most recent vital signs: Vitals:   04/10/22 1249  BP: (!) 143/71  Pulse: 74  Resp: 17  Temp: 98.7 F (37.1 C)  SpO2: 100%     General: Awake, no distress.  Alert, oriented x 3, talkative and answering questions appropriately. CV:  Good peripheral perfusion.  Heart regular rate and rhythm. Resp:  Normal effort.  Lungs are clear bilaterally. Abd:  No distention.  Soft, nontender. Other:  On examination of left shoulder there is moderate tenderness to palpation and range of motion is restricted secondary to pain.  No skin abrasions or discoloration at this time.  Patient also has some tenderness on palpation of the left forearm without deformity.  Able to move digits without any difficulty and capillary refills less than 3 seconds.  Skin is intact.  No tenderness on palpation of the lower extremities and patient is able to move without any assistance.  No point tenderness on  palpation of cervical, thoracic or lumbar spine to palpation.   ED Results / Procedures / Treatments   Labs (all labs ordered are listed, but only abnormal results are displayed) Labs Reviewed - No data to display    RADIOLOGY     PROCEDURES:  Critical Care performed:   Procedures   MEDICATIONS ORDERED IN ED: Medications  fentaNYL (SUBLIMAZE) injection 12.5 mcg (12.5 mcg Intravenous Not Given 04/10/22 1444)  acetaminophen (TYLENOL) tablet 500 mg (500 mg Oral Given 04/10/22 1355)  morphine (PF) 2 MG/ML injection 2 mg (2 mg Intramuscular Given 04/10/22 1449)     IMPRESSION / MDM / ASSESSMENT AND PLAN / ED COURSE  I reviewed the triage vital signs and the nursing notes.   Differential diagnosis includes, but is not limited to, head injury, cervical sprain, fracture, contusion, left shoulder fracture, dislocation, contusion and left forearm fracture, contusion.  87 year old female presents to the ED via EMS after a fall that occurred at home in which she tripped over a piece of flooring that was sticking up.  Patient landed on her left  shoulder where most of her pain is located.  She denies any loss of consciousness.  During the time that she was in the emergency department she requested Tylenol for pain and refused fentanyl 12.5 mg IV as she did not want to take anything strong.  Prior to discharge her pain was increased and she agreed to a very small amount of IM morphine as she has taken that in the past and not had any difficulties with sedation or fogginess.  She also reports that she is very sensitive to pain medication and even though some hydrocodone is being sent to the pharmacy she will break these tablets into and only take half every 8 hours if needed for pain.  She is encouraged to ice her shoulder and wear the shoulder immobilizer for protection and support.  She has to follow-up with Dr. Roland Rack who is on-call for orthopedics.  Patient was ambulatory at the time of  discharge.      Patient's presentation is most consistent with acute complicated illness / injury requiring diagnostic workup.  FINAL CLINICAL IMPRESSION(S) / ED DIAGNOSES   Final diagnoses:  Closed fracture of proximal end of left humerus, unspecified fracture morphology, initial encounter  Multiple contusions  Fall in home, initial encounter     Rx / DC Orders   ED Discharge Orders          Ordered    HYDROcodone-acetaminophen (NORCO/VICODIN) 5-325 MG tablet  Every 8 hours        04/10/22 1511             Note:  This document was prepared using Dragon voice recognition software and may include unintentional dictation errors.   Johnn Hai, PA-C 04/10/22 1518    Merlyn Lot, MD 04/10/22 1525

## 2022-04-17 ENCOUNTER — Ambulatory Visit
Admission: RE | Admit: 2022-04-17 | Discharge: 2022-04-17 | Disposition: A | Discharge status: Home / Self Care | Payer: HMO | Source: Ambulatory Visit | Attending: Student | Admitting: Student

## 2022-04-17 ENCOUNTER — Other Ambulatory Visit: Payer: Self-pay | Admitting: Student

## 2022-04-17 DIAGNOSIS — S42292A Other displaced fracture of upper end of left humerus, initial encounter for closed fracture: Secondary | ICD-10-CM

## 2022-04-17 DIAGNOSIS — S42212A Unspecified displaced fracture of surgical neck of left humerus, initial encounter for closed fracture: Secondary | ICD-10-CM | POA: Diagnosis not present

## 2022-04-23 DIAGNOSIS — S42292D Other displaced fracture of upper end of left humerus, subsequent encounter for fracture with routine healing: Secondary | ICD-10-CM | POA: Diagnosis not present

## 2022-05-08 DIAGNOSIS — S42292D Other displaced fracture of upper end of left humerus, subsequent encounter for fracture with routine healing: Secondary | ICD-10-CM | POA: Diagnosis not present

## 2022-05-14 DIAGNOSIS — G4700 Insomnia, unspecified: Secondary | ICD-10-CM | POA: Diagnosis not present

## 2022-05-14 DIAGNOSIS — M06251 Rheumatoid bursitis, right hip: Secondary | ICD-10-CM | POA: Diagnosis not present

## 2022-05-14 DIAGNOSIS — L4051 Distal interphalangeal psoriatic arthropathy: Secondary | ICD-10-CM | POA: Diagnosis not present

## 2022-05-14 DIAGNOSIS — R32 Unspecified urinary incontinence: Secondary | ICD-10-CM | POA: Diagnosis not present

## 2022-05-14 DIAGNOSIS — I10 Essential (primary) hypertension: Secondary | ICD-10-CM | POA: Diagnosis not present

## 2022-05-14 DIAGNOSIS — E559 Vitamin D deficiency, unspecified: Secondary | ICD-10-CM | POA: Diagnosis not present

## 2022-05-14 DIAGNOSIS — E663 Overweight: Secondary | ICD-10-CM | POA: Diagnosis not present

## 2022-05-14 DIAGNOSIS — M06252 Rheumatoid bursitis, left hip: Secondary | ICD-10-CM | POA: Diagnosis not present

## 2022-05-14 DIAGNOSIS — M720 Palmar fascial fibromatosis [Dupuytren]: Secondary | ICD-10-CM | POA: Diagnosis not present

## 2022-05-14 DIAGNOSIS — R42 Dizziness and giddiness: Secondary | ICD-10-CM | POA: Diagnosis not present

## 2022-05-14 DIAGNOSIS — E785 Hyperlipidemia, unspecified: Secondary | ICD-10-CM | POA: Diagnosis not present

## 2022-05-22 DIAGNOSIS — S42292D Other displaced fracture of upper end of left humerus, subsequent encounter for fracture with routine healing: Secondary | ICD-10-CM | POA: Diagnosis not present

## 2022-05-23 ENCOUNTER — Other Ambulatory Visit: Payer: Self-pay | Admitting: Physician Assistant

## 2022-05-23 DIAGNOSIS — S42292S Other displaced fracture of upper end of left humerus, sequela: Secondary | ICD-10-CM

## 2022-05-24 DIAGNOSIS — R1310 Dysphagia, unspecified: Secondary | ICD-10-CM | POA: Diagnosis not present

## 2022-05-24 DIAGNOSIS — Z2821 Immunization not carried out because of patient refusal: Secondary | ICD-10-CM | POA: Diagnosis not present

## 2022-05-24 DIAGNOSIS — S42292S Other displaced fracture of upper end of left humerus, sequela: Secondary | ICD-10-CM | POA: Diagnosis not present

## 2022-05-24 DIAGNOSIS — E782 Mixed hyperlipidemia: Secondary | ICD-10-CM | POA: Diagnosis not present

## 2022-05-24 DIAGNOSIS — D751 Secondary polycythemia: Secondary | ICD-10-CM | POA: Diagnosis not present

## 2022-05-24 DIAGNOSIS — Z0000 Encounter for general adult medical examination without abnormal findings: Secondary | ICD-10-CM | POA: Diagnosis not present

## 2022-05-24 DIAGNOSIS — Z23 Encounter for immunization: Secondary | ICD-10-CM | POA: Diagnosis not present

## 2022-05-24 DIAGNOSIS — Z789 Other specified health status: Secondary | ICD-10-CM | POA: Diagnosis not present

## 2022-06-02 ENCOUNTER — Ambulatory Visit
Admission: RE | Admit: 2022-06-02 | Discharge: 2022-06-02 | Disposition: A | Discharge status: Home / Self Care | Payer: HMO | Source: Ambulatory Visit | Attending: Physician Assistant | Admitting: Physician Assistant

## 2022-06-02 DIAGNOSIS — S42292S Other displaced fracture of upper end of left humerus, sequela: Secondary | ICD-10-CM

## 2022-06-02 DIAGNOSIS — S46012A Strain of muscle(s) and tendon(s) of the rotator cuff of left shoulder, initial encounter: Secondary | ICD-10-CM | POA: Diagnosis not present

## 2022-06-12 DIAGNOSIS — M25612 Stiffness of left shoulder, not elsewhere classified: Secondary | ICD-10-CM | POA: Diagnosis not present

## 2022-06-12 DIAGNOSIS — M6281 Muscle weakness (generalized): Secondary | ICD-10-CM | POA: Diagnosis not present

## 2022-06-12 DIAGNOSIS — M25512 Pain in left shoulder: Secondary | ICD-10-CM | POA: Diagnosis not present

## 2022-06-12 DIAGNOSIS — M67912 Unspecified disorder of synovium and tendon, left shoulder: Secondary | ICD-10-CM | POA: Diagnosis not present

## 2022-06-21 DIAGNOSIS — M6281 Muscle weakness (generalized): Secondary | ICD-10-CM | POA: Diagnosis not present

## 2022-06-21 DIAGNOSIS — M67912 Unspecified disorder of synovium and tendon, left shoulder: Secondary | ICD-10-CM | POA: Diagnosis not present

## 2022-06-21 DIAGNOSIS — M25512 Pain in left shoulder: Secondary | ICD-10-CM | POA: Diagnosis not present

## 2022-06-21 DIAGNOSIS — M25612 Stiffness of left shoulder, not elsewhere classified: Secondary | ICD-10-CM | POA: Diagnosis not present

## 2022-06-26 DIAGNOSIS — M67912 Unspecified disorder of synovium and tendon, left shoulder: Secondary | ICD-10-CM | POA: Diagnosis not present

## 2022-06-26 DIAGNOSIS — M25512 Pain in left shoulder: Secondary | ICD-10-CM | POA: Diagnosis not present

## 2022-06-26 DIAGNOSIS — M25612 Stiffness of left shoulder, not elsewhere classified: Secondary | ICD-10-CM | POA: Diagnosis not present

## 2022-06-26 DIAGNOSIS — M6281 Muscle weakness (generalized): Secondary | ICD-10-CM | POA: Diagnosis not present

## 2022-06-27 ENCOUNTER — Ambulatory Visit: Payer: Medicare HMO | Admitting: Nurse Practitioner

## 2022-06-28 DIAGNOSIS — M25512 Pain in left shoulder: Secondary | ICD-10-CM | POA: Diagnosis not present

## 2022-06-28 DIAGNOSIS — M6281 Muscle weakness (generalized): Secondary | ICD-10-CM | POA: Diagnosis not present

## 2022-06-28 DIAGNOSIS — M25612 Stiffness of left shoulder, not elsewhere classified: Secondary | ICD-10-CM | POA: Diagnosis not present

## 2022-06-28 DIAGNOSIS — M67912 Unspecified disorder of synovium and tendon, left shoulder: Secondary | ICD-10-CM | POA: Diagnosis not present

## 2022-07-03 DIAGNOSIS — M67912 Unspecified disorder of synovium and tendon, left shoulder: Secondary | ICD-10-CM | POA: Diagnosis not present

## 2022-07-03 DIAGNOSIS — M6281 Muscle weakness (generalized): Secondary | ICD-10-CM | POA: Diagnosis not present

## 2022-07-03 DIAGNOSIS — M25512 Pain in left shoulder: Secondary | ICD-10-CM | POA: Diagnosis not present

## 2022-07-03 DIAGNOSIS — M25612 Stiffness of left shoulder, not elsewhere classified: Secondary | ICD-10-CM | POA: Diagnosis not present

## 2022-07-05 DIAGNOSIS — M25512 Pain in left shoulder: Secondary | ICD-10-CM | POA: Diagnosis not present

## 2022-07-05 DIAGNOSIS — M25612 Stiffness of left shoulder, not elsewhere classified: Secondary | ICD-10-CM | POA: Diagnosis not present

## 2022-07-05 DIAGNOSIS — M6281 Muscle weakness (generalized): Secondary | ICD-10-CM | POA: Diagnosis not present

## 2022-07-05 DIAGNOSIS — M67912 Unspecified disorder of synovium and tendon, left shoulder: Secondary | ICD-10-CM | POA: Diagnosis not present

## 2022-07-06 DIAGNOSIS — M7582 Other shoulder lesions, left shoulder: Secondary | ICD-10-CM | POA: Diagnosis not present

## 2022-07-06 DIAGNOSIS — S42202A Unspecified fracture of upper end of left humerus, initial encounter for closed fracture: Secondary | ICD-10-CM | POA: Diagnosis not present

## 2022-07-10 DIAGNOSIS — M6281 Muscle weakness (generalized): Secondary | ICD-10-CM | POA: Diagnosis not present

## 2022-07-10 DIAGNOSIS — M25512 Pain in left shoulder: Secondary | ICD-10-CM | POA: Diagnosis not present

## 2022-07-10 DIAGNOSIS — M25612 Stiffness of left shoulder, not elsewhere classified: Secondary | ICD-10-CM | POA: Diagnosis not present

## 2022-07-10 DIAGNOSIS — M67912 Unspecified disorder of synovium and tendon, left shoulder: Secondary | ICD-10-CM | POA: Diagnosis not present

## 2022-07-12 DIAGNOSIS — M6281 Muscle weakness (generalized): Secondary | ICD-10-CM | POA: Diagnosis not present

## 2022-07-12 DIAGNOSIS — M25612 Stiffness of left shoulder, not elsewhere classified: Secondary | ICD-10-CM | POA: Diagnosis not present

## 2022-07-12 DIAGNOSIS — M67912 Unspecified disorder of synovium and tendon, left shoulder: Secondary | ICD-10-CM | POA: Diagnosis not present

## 2022-07-12 DIAGNOSIS — M25512 Pain in left shoulder: Secondary | ICD-10-CM | POA: Diagnosis not present

## 2022-07-17 DIAGNOSIS — M67912 Unspecified disorder of synovium and tendon, left shoulder: Secondary | ICD-10-CM | POA: Diagnosis not present

## 2022-07-17 DIAGNOSIS — M25612 Stiffness of left shoulder, not elsewhere classified: Secondary | ICD-10-CM | POA: Diagnosis not present

## 2022-07-17 DIAGNOSIS — M25512 Pain in left shoulder: Secondary | ICD-10-CM | POA: Diagnosis not present

## 2022-07-17 DIAGNOSIS — M6281 Muscle weakness (generalized): Secondary | ICD-10-CM | POA: Diagnosis not present

## 2022-07-19 DIAGNOSIS — M25612 Stiffness of left shoulder, not elsewhere classified: Secondary | ICD-10-CM | POA: Diagnosis not present

## 2022-07-19 DIAGNOSIS — M6281 Muscle weakness (generalized): Secondary | ICD-10-CM | POA: Diagnosis not present

## 2022-07-19 DIAGNOSIS — M25512 Pain in left shoulder: Secondary | ICD-10-CM | POA: Diagnosis not present

## 2022-07-19 DIAGNOSIS — M67912 Unspecified disorder of synovium and tendon, left shoulder: Secondary | ICD-10-CM | POA: Diagnosis not present

## 2022-08-14 DIAGNOSIS — M6281 Muscle weakness (generalized): Secondary | ICD-10-CM | POA: Diagnosis not present

## 2022-08-14 DIAGNOSIS — M25612 Stiffness of left shoulder, not elsewhere classified: Secondary | ICD-10-CM | POA: Diagnosis not present

## 2022-08-14 DIAGNOSIS — M67912 Unspecified disorder of synovium and tendon, left shoulder: Secondary | ICD-10-CM | POA: Diagnosis not present

## 2022-08-14 DIAGNOSIS — M25512 Pain in left shoulder: Secondary | ICD-10-CM | POA: Diagnosis not present

## 2022-08-16 DIAGNOSIS — M67912 Unspecified disorder of synovium and tendon, left shoulder: Secondary | ICD-10-CM | POA: Diagnosis not present

## 2022-08-16 DIAGNOSIS — M25512 Pain in left shoulder: Secondary | ICD-10-CM | POA: Diagnosis not present

## 2022-08-16 DIAGNOSIS — M25612 Stiffness of left shoulder, not elsewhere classified: Secondary | ICD-10-CM | POA: Diagnosis not present

## 2022-08-16 DIAGNOSIS — M6281 Muscle weakness (generalized): Secondary | ICD-10-CM | POA: Diagnosis not present

## 2022-08-17 DIAGNOSIS — S42202D Unspecified fracture of upper end of left humerus, subsequent encounter for fracture with routine healing: Secondary | ICD-10-CM | POA: Diagnosis not present

## 2022-08-17 DIAGNOSIS — M7582 Other shoulder lesions, left shoulder: Secondary | ICD-10-CM | POA: Diagnosis not present

## 2022-08-21 DIAGNOSIS — M67912 Unspecified disorder of synovium and tendon, left shoulder: Secondary | ICD-10-CM | POA: Diagnosis not present

## 2022-08-21 DIAGNOSIS — M6281 Muscle weakness (generalized): Secondary | ICD-10-CM | POA: Diagnosis not present

## 2022-08-21 DIAGNOSIS — M25612 Stiffness of left shoulder, not elsewhere classified: Secondary | ICD-10-CM | POA: Diagnosis not present

## 2022-08-21 DIAGNOSIS — M25512 Pain in left shoulder: Secondary | ICD-10-CM | POA: Diagnosis not present

## 2022-08-23 DIAGNOSIS — I10 Essential (primary) hypertension: Secondary | ICD-10-CM | POA: Diagnosis not present

## 2022-08-23 DIAGNOSIS — E559 Vitamin D deficiency, unspecified: Secondary | ICD-10-CM | POA: Diagnosis not present

## 2022-08-23 DIAGNOSIS — M25512 Pain in left shoulder: Secondary | ICD-10-CM | POA: Diagnosis not present

## 2022-08-23 DIAGNOSIS — R1310 Dysphagia, unspecified: Secondary | ICD-10-CM | POA: Diagnosis not present

## 2022-08-23 DIAGNOSIS — H5789 Other specified disorders of eye and adnexa: Secondary | ICD-10-CM | POA: Diagnosis not present

## 2022-08-23 DIAGNOSIS — E782 Mixed hyperlipidemia: Secondary | ICD-10-CM | POA: Diagnosis not present

## 2022-08-23 DIAGNOSIS — M25612 Stiffness of left shoulder, not elsewhere classified: Secondary | ICD-10-CM | POA: Diagnosis not present

## 2022-08-23 DIAGNOSIS — R200 Anesthesia of skin: Secondary | ICD-10-CM | POA: Diagnosis not present

## 2022-08-23 DIAGNOSIS — M67912 Unspecified disorder of synovium and tendon, left shoulder: Secondary | ICD-10-CM | POA: Diagnosis not present

## 2022-08-23 DIAGNOSIS — M7582 Other shoulder lesions, left shoulder: Secondary | ICD-10-CM | POA: Diagnosis not present

## 2022-08-23 DIAGNOSIS — M6281 Muscle weakness (generalized): Secondary | ICD-10-CM | POA: Diagnosis not present

## 2022-08-28 DIAGNOSIS — M25612 Stiffness of left shoulder, not elsewhere classified: Secondary | ICD-10-CM | POA: Diagnosis not present

## 2022-08-28 DIAGNOSIS — M67912 Unspecified disorder of synovium and tendon, left shoulder: Secondary | ICD-10-CM | POA: Diagnosis not present

## 2022-08-28 DIAGNOSIS — M6281 Muscle weakness (generalized): Secondary | ICD-10-CM | POA: Diagnosis not present

## 2022-08-28 DIAGNOSIS — M25512 Pain in left shoulder: Secondary | ICD-10-CM | POA: Diagnosis not present

## 2022-08-30 DIAGNOSIS — M25512 Pain in left shoulder: Secondary | ICD-10-CM | POA: Diagnosis not present

## 2022-08-30 DIAGNOSIS — M6281 Muscle weakness (generalized): Secondary | ICD-10-CM | POA: Diagnosis not present

## 2022-08-30 DIAGNOSIS — M67912 Unspecified disorder of synovium and tendon, left shoulder: Secondary | ICD-10-CM | POA: Diagnosis not present

## 2022-08-30 DIAGNOSIS — M25612 Stiffness of left shoulder, not elsewhere classified: Secondary | ICD-10-CM | POA: Diagnosis not present

## 2022-09-04 DIAGNOSIS — M25512 Pain in left shoulder: Secondary | ICD-10-CM | POA: Diagnosis not present

## 2022-09-04 DIAGNOSIS — M67912 Unspecified disorder of synovium and tendon, left shoulder: Secondary | ICD-10-CM | POA: Diagnosis not present

## 2022-09-04 DIAGNOSIS — M6281 Muscle weakness (generalized): Secondary | ICD-10-CM | POA: Diagnosis not present

## 2022-09-04 DIAGNOSIS — M25612 Stiffness of left shoulder, not elsewhere classified: Secondary | ICD-10-CM | POA: Diagnosis not present

## 2022-09-11 DIAGNOSIS — M25512 Pain in left shoulder: Secondary | ICD-10-CM | POA: Diagnosis not present

## 2022-09-19 DIAGNOSIS — M8580 Other specified disorders of bone density and structure, unspecified site: Secondary | ICD-10-CM | POA: Diagnosis not present

## 2022-09-19 DIAGNOSIS — M47814 Spondylosis without myelopathy or radiculopathy, thoracic region: Secondary | ICD-10-CM | POA: Diagnosis not present

## 2022-09-19 DIAGNOSIS — M47896 Other spondylosis, lumbar region: Secondary | ICD-10-CM | POA: Diagnosis not present

## 2022-09-19 DIAGNOSIS — M47892 Other spondylosis, cervical region: Secondary | ICD-10-CM | POA: Diagnosis not present

## 2022-09-19 DIAGNOSIS — M47812 Spondylosis without myelopathy or radiculopathy, cervical region: Secondary | ICD-10-CM | POA: Diagnosis not present

## 2022-09-19 DIAGNOSIS — M47816 Spondylosis without myelopathy or radiculopathy, lumbar region: Secondary | ICD-10-CM | POA: Diagnosis not present

## 2022-09-19 DIAGNOSIS — M4312 Spondylolisthesis, cervical region: Secondary | ICD-10-CM | POA: Diagnosis not present

## 2022-09-20 DIAGNOSIS — M47816 Spondylosis without myelopathy or radiculopathy, lumbar region: Secondary | ICD-10-CM | POA: Diagnosis not present

## 2022-09-20 DIAGNOSIS — M25559 Pain in unspecified hip: Secondary | ICD-10-CM | POA: Diagnosis not present

## 2022-09-20 DIAGNOSIS — M778 Other enthesopathies, not elsewhere classified: Secondary | ICD-10-CM | POA: Diagnosis not present

## 2022-09-24 DIAGNOSIS — M4312 Spondylolisthesis, cervical region: Secondary | ICD-10-CM | POA: Diagnosis not present

## 2022-09-24 DIAGNOSIS — M25559 Pain in unspecified hip: Secondary | ICD-10-CM | POA: Diagnosis not present

## 2022-09-24 DIAGNOSIS — M479 Spondylosis, unspecified: Secondary | ICD-10-CM | POA: Diagnosis not present

## 2022-09-24 DIAGNOSIS — M8580 Other specified disorders of bone density and structure, unspecified site: Secondary | ICD-10-CM | POA: Diagnosis not present

## 2022-09-25 DIAGNOSIS — M67912 Unspecified disorder of synovium and tendon, left shoulder: Secondary | ICD-10-CM | POA: Diagnosis not present

## 2022-09-25 DIAGNOSIS — M6281 Muscle weakness (generalized): Secondary | ICD-10-CM | POA: Diagnosis not present

## 2022-09-25 DIAGNOSIS — M25512 Pain in left shoulder: Secondary | ICD-10-CM | POA: Diagnosis not present

## 2022-09-25 DIAGNOSIS — M25612 Stiffness of left shoulder, not elsewhere classified: Secondary | ICD-10-CM | POA: Diagnosis not present

## 2022-09-27 DIAGNOSIS — M25512 Pain in left shoulder: Secondary | ICD-10-CM | POA: Diagnosis not present

## 2022-09-27 DIAGNOSIS — M6281 Muscle weakness (generalized): Secondary | ICD-10-CM | POA: Diagnosis not present

## 2022-09-27 DIAGNOSIS — M67912 Unspecified disorder of synovium and tendon, left shoulder: Secondary | ICD-10-CM | POA: Diagnosis not present

## 2022-09-27 DIAGNOSIS — M25612 Stiffness of left shoulder, not elsewhere classified: Secondary | ICD-10-CM | POA: Diagnosis not present

## 2022-10-02 DIAGNOSIS — M6281 Muscle weakness (generalized): Secondary | ICD-10-CM | POA: Diagnosis not present

## 2022-10-02 DIAGNOSIS — M25612 Stiffness of left shoulder, not elsewhere classified: Secondary | ICD-10-CM | POA: Diagnosis not present

## 2022-10-02 DIAGNOSIS — M25512 Pain in left shoulder: Secondary | ICD-10-CM | POA: Diagnosis not present

## 2022-10-02 DIAGNOSIS — M67912 Unspecified disorder of synovium and tendon, left shoulder: Secondary | ICD-10-CM | POA: Diagnosis not present

## 2022-10-05 DIAGNOSIS — M25512 Pain in left shoulder: Secondary | ICD-10-CM | POA: Diagnosis not present

## 2022-10-05 DIAGNOSIS — M25612 Stiffness of left shoulder, not elsewhere classified: Secondary | ICD-10-CM | POA: Diagnosis not present

## 2022-10-05 DIAGNOSIS — M6281 Muscle weakness (generalized): Secondary | ICD-10-CM | POA: Diagnosis not present

## 2022-10-05 DIAGNOSIS — M67912 Unspecified disorder of synovium and tendon, left shoulder: Secondary | ICD-10-CM | POA: Diagnosis not present

## 2022-10-09 DIAGNOSIS — M25612 Stiffness of left shoulder, not elsewhere classified: Secondary | ICD-10-CM | POA: Diagnosis not present

## 2022-10-09 DIAGNOSIS — M6281 Muscle weakness (generalized): Secondary | ICD-10-CM | POA: Diagnosis not present

## 2022-10-09 DIAGNOSIS — M67912 Unspecified disorder of synovium and tendon, left shoulder: Secondary | ICD-10-CM | POA: Diagnosis not present

## 2022-10-09 DIAGNOSIS — M25512 Pain in left shoulder: Secondary | ICD-10-CM | POA: Diagnosis not present

## 2022-10-11 DIAGNOSIS — M67912 Unspecified disorder of synovium and tendon, left shoulder: Secondary | ICD-10-CM | POA: Diagnosis not present

## 2022-10-11 DIAGNOSIS — M25512 Pain in left shoulder: Secondary | ICD-10-CM | POA: Diagnosis not present

## 2022-10-11 DIAGNOSIS — M6281 Muscle weakness (generalized): Secondary | ICD-10-CM | POA: Diagnosis not present

## 2022-10-11 DIAGNOSIS — M25612 Stiffness of left shoulder, not elsewhere classified: Secondary | ICD-10-CM | POA: Diagnosis not present

## 2022-10-18 DIAGNOSIS — M25512 Pain in left shoulder: Secondary | ICD-10-CM | POA: Diagnosis not present

## 2022-10-18 DIAGNOSIS — M67912 Unspecified disorder of synovium and tendon, left shoulder: Secondary | ICD-10-CM | POA: Diagnosis not present

## 2022-10-18 DIAGNOSIS — M25612 Stiffness of left shoulder, not elsewhere classified: Secondary | ICD-10-CM | POA: Diagnosis not present

## 2022-10-22 DIAGNOSIS — M67912 Unspecified disorder of synovium and tendon, left shoulder: Secondary | ICD-10-CM | POA: Diagnosis not present

## 2022-10-22 DIAGNOSIS — M25612 Stiffness of left shoulder, not elsewhere classified: Secondary | ICD-10-CM | POA: Diagnosis not present

## 2022-10-22 DIAGNOSIS — M6281 Muscle weakness (generalized): Secondary | ICD-10-CM | POA: Diagnosis not present

## 2022-10-22 DIAGNOSIS — M25512 Pain in left shoulder: Secondary | ICD-10-CM | POA: Diagnosis not present

## 2022-10-24 DIAGNOSIS — M67912 Unspecified disorder of synovium and tendon, left shoulder: Secondary | ICD-10-CM | POA: Diagnosis not present

## 2022-10-24 DIAGNOSIS — M6281 Muscle weakness (generalized): Secondary | ICD-10-CM | POA: Diagnosis not present

## 2022-10-24 DIAGNOSIS — M25612 Stiffness of left shoulder, not elsewhere classified: Secondary | ICD-10-CM | POA: Diagnosis not present

## 2022-10-24 DIAGNOSIS — M25512 Pain in left shoulder: Secondary | ICD-10-CM | POA: Diagnosis not present

## 2022-10-30 DIAGNOSIS — M25512 Pain in left shoulder: Secondary | ICD-10-CM | POA: Diagnosis not present

## 2022-10-30 DIAGNOSIS — M67912 Unspecified disorder of synovium and tendon, left shoulder: Secondary | ICD-10-CM | POA: Diagnosis not present

## 2022-10-30 DIAGNOSIS — M6281 Muscle weakness (generalized): Secondary | ICD-10-CM | POA: Diagnosis not present

## 2022-10-30 DIAGNOSIS — M25612 Stiffness of left shoulder, not elsewhere classified: Secondary | ICD-10-CM | POA: Diagnosis not present

## 2022-11-01 DIAGNOSIS — M25612 Stiffness of left shoulder, not elsewhere classified: Secondary | ICD-10-CM | POA: Diagnosis not present

## 2022-11-01 DIAGNOSIS — M67912 Unspecified disorder of synovium and tendon, left shoulder: Secondary | ICD-10-CM | POA: Diagnosis not present

## 2022-11-01 DIAGNOSIS — M6281 Muscle weakness (generalized): Secondary | ICD-10-CM | POA: Diagnosis not present

## 2022-11-01 DIAGNOSIS — M25512 Pain in left shoulder: Secondary | ICD-10-CM | POA: Diagnosis not present

## 2022-11-06 DIAGNOSIS — M6281 Muscle weakness (generalized): Secondary | ICD-10-CM | POA: Diagnosis not present

## 2022-11-06 DIAGNOSIS — M67912 Unspecified disorder of synovium and tendon, left shoulder: Secondary | ICD-10-CM | POA: Diagnosis not present

## 2022-11-06 DIAGNOSIS — M25612 Stiffness of left shoulder, not elsewhere classified: Secondary | ICD-10-CM | POA: Diagnosis not present

## 2022-11-06 DIAGNOSIS — M25512 Pain in left shoulder: Secondary | ICD-10-CM | POA: Diagnosis not present

## 2022-11-13 DIAGNOSIS — M6281 Muscle weakness (generalized): Secondary | ICD-10-CM | POA: Diagnosis not present

## 2022-11-13 DIAGNOSIS — M25612 Stiffness of left shoulder, not elsewhere classified: Secondary | ICD-10-CM | POA: Diagnosis not present

## 2022-11-13 DIAGNOSIS — M25512 Pain in left shoulder: Secondary | ICD-10-CM | POA: Diagnosis not present

## 2022-11-13 DIAGNOSIS — M67912 Unspecified disorder of synovium and tendon, left shoulder: Secondary | ICD-10-CM | POA: Diagnosis not present

## 2022-11-15 DIAGNOSIS — M6281 Muscle weakness (generalized): Secondary | ICD-10-CM | POA: Diagnosis not present

## 2022-11-15 DIAGNOSIS — M25612 Stiffness of left shoulder, not elsewhere classified: Secondary | ICD-10-CM | POA: Diagnosis not present

## 2022-11-15 DIAGNOSIS — M25512 Pain in left shoulder: Secondary | ICD-10-CM | POA: Diagnosis not present

## 2022-11-15 DIAGNOSIS — M67912 Unspecified disorder of synovium and tendon, left shoulder: Secondary | ICD-10-CM | POA: Diagnosis not present

## 2022-11-20 DIAGNOSIS — M6281 Muscle weakness (generalized): Secondary | ICD-10-CM | POA: Diagnosis not present

## 2022-11-20 DIAGNOSIS — M67912 Unspecified disorder of synovium and tendon, left shoulder: Secondary | ICD-10-CM | POA: Diagnosis not present

## 2022-11-20 DIAGNOSIS — M25512 Pain in left shoulder: Secondary | ICD-10-CM | POA: Diagnosis not present

## 2022-11-20 DIAGNOSIS — M25612 Stiffness of left shoulder, not elsewhere classified: Secondary | ICD-10-CM | POA: Diagnosis not present

## 2022-11-23 DIAGNOSIS — R6889 Other general symptoms and signs: Secondary | ICD-10-CM | POA: Diagnosis not present

## 2022-11-23 DIAGNOSIS — E559 Vitamin D deficiency, unspecified: Secondary | ICD-10-CM | POA: Diagnosis not present

## 2022-11-23 DIAGNOSIS — R200 Anesthesia of skin: Secondary | ICD-10-CM | POA: Diagnosis not present

## 2022-11-23 DIAGNOSIS — D751 Secondary polycythemia: Secondary | ICD-10-CM | POA: Diagnosis not present

## 2022-11-23 DIAGNOSIS — I10 Essential (primary) hypertension: Secondary | ICD-10-CM | POA: Diagnosis not present

## 2022-11-23 DIAGNOSIS — E782 Mixed hyperlipidemia: Secondary | ICD-10-CM | POA: Diagnosis not present

## 2022-11-27 DIAGNOSIS — M25612 Stiffness of left shoulder, not elsewhere classified: Secondary | ICD-10-CM | POA: Diagnosis not present

## 2022-11-27 DIAGNOSIS — M67912 Unspecified disorder of synovium and tendon, left shoulder: Secondary | ICD-10-CM | POA: Diagnosis not present

## 2022-11-27 DIAGNOSIS — M6281 Muscle weakness (generalized): Secondary | ICD-10-CM | POA: Diagnosis not present

## 2022-11-27 DIAGNOSIS — M25512 Pain in left shoulder: Secondary | ICD-10-CM | POA: Diagnosis not present

## 2022-11-29 DIAGNOSIS — M25612 Stiffness of left shoulder, not elsewhere classified: Secondary | ICD-10-CM | POA: Diagnosis not present

## 2022-11-29 DIAGNOSIS — M67912 Unspecified disorder of synovium and tendon, left shoulder: Secondary | ICD-10-CM | POA: Diagnosis not present

## 2022-11-29 DIAGNOSIS — M6281 Muscle weakness (generalized): Secondary | ICD-10-CM | POA: Diagnosis not present

## 2022-11-29 DIAGNOSIS — M25512 Pain in left shoulder: Secondary | ICD-10-CM | POA: Diagnosis not present

## 2022-12-11 DIAGNOSIS — M67912 Unspecified disorder of synovium and tendon, left shoulder: Secondary | ICD-10-CM | POA: Diagnosis not present

## 2022-12-11 DIAGNOSIS — M25612 Stiffness of left shoulder, not elsewhere classified: Secondary | ICD-10-CM | POA: Diagnosis not present

## 2022-12-11 DIAGNOSIS — M25512 Pain in left shoulder: Secondary | ICD-10-CM | POA: Diagnosis not present

## 2022-12-18 DIAGNOSIS — M25512 Pain in left shoulder: Secondary | ICD-10-CM | POA: Diagnosis not present

## 2022-12-18 DIAGNOSIS — M67912 Unspecified disorder of synovium and tendon, left shoulder: Secondary | ICD-10-CM | POA: Diagnosis not present

## 2022-12-18 DIAGNOSIS — M25612 Stiffness of left shoulder, not elsewhere classified: Secondary | ICD-10-CM | POA: Diagnosis not present

## 2022-12-18 DIAGNOSIS — M6281 Muscle weakness (generalized): Secondary | ICD-10-CM | POA: Diagnosis not present

## 2022-12-20 DIAGNOSIS — M25612 Stiffness of left shoulder, not elsewhere classified: Secondary | ICD-10-CM | POA: Diagnosis not present

## 2022-12-20 DIAGNOSIS — M25512 Pain in left shoulder: Secondary | ICD-10-CM | POA: Diagnosis not present

## 2022-12-20 DIAGNOSIS — M6281 Muscle weakness (generalized): Secondary | ICD-10-CM | POA: Diagnosis not present

## 2022-12-20 DIAGNOSIS — M67912 Unspecified disorder of synovium and tendon, left shoulder: Secondary | ICD-10-CM | POA: Diagnosis not present

## 2022-12-25 DIAGNOSIS — M67912 Unspecified disorder of synovium and tendon, left shoulder: Secondary | ICD-10-CM | POA: Diagnosis not present

## 2022-12-25 DIAGNOSIS — M25512 Pain in left shoulder: Secondary | ICD-10-CM | POA: Diagnosis not present

## 2022-12-25 DIAGNOSIS — M25612 Stiffness of left shoulder, not elsewhere classified: Secondary | ICD-10-CM | POA: Diagnosis not present

## 2022-12-25 DIAGNOSIS — M6281 Muscle weakness (generalized): Secondary | ICD-10-CM | POA: Diagnosis not present

## 2023-01-03 DIAGNOSIS — M6281 Muscle weakness (generalized): Secondary | ICD-10-CM | POA: Diagnosis not present

## 2023-01-03 DIAGNOSIS — M67912 Unspecified disorder of synovium and tendon, left shoulder: Secondary | ICD-10-CM | POA: Diagnosis not present

## 2023-01-03 DIAGNOSIS — M25612 Stiffness of left shoulder, not elsewhere classified: Secondary | ICD-10-CM | POA: Diagnosis not present

## 2023-01-03 DIAGNOSIS — M25512 Pain in left shoulder: Secondary | ICD-10-CM | POA: Diagnosis not present

## 2023-01-10 DIAGNOSIS — M67912 Unspecified disorder of synovium and tendon, left shoulder: Secondary | ICD-10-CM | POA: Diagnosis not present

## 2023-01-10 DIAGNOSIS — M25612 Stiffness of left shoulder, not elsewhere classified: Secondary | ICD-10-CM | POA: Diagnosis not present

## 2023-01-10 DIAGNOSIS — M6281 Muscle weakness (generalized): Secondary | ICD-10-CM | POA: Diagnosis not present

## 2023-01-10 DIAGNOSIS — M25512 Pain in left shoulder: Secondary | ICD-10-CM | POA: Diagnosis not present

## 2023-01-17 DIAGNOSIS — M67912 Unspecified disorder of synovium and tendon, left shoulder: Secondary | ICD-10-CM | POA: Diagnosis not present

## 2023-01-17 DIAGNOSIS — M25612 Stiffness of left shoulder, not elsewhere classified: Secondary | ICD-10-CM | POA: Diagnosis not present

## 2023-01-17 DIAGNOSIS — M25512 Pain in left shoulder: Secondary | ICD-10-CM | POA: Diagnosis not present

## 2023-01-17 DIAGNOSIS — M6281 Muscle weakness (generalized): Secondary | ICD-10-CM | POA: Diagnosis not present

## 2023-01-18 DIAGNOSIS — H538 Other visual disturbances: Secondary | ICD-10-CM | POA: Diagnosis not present

## 2023-01-18 DIAGNOSIS — H40013 Open angle with borderline findings, low risk, bilateral: Secondary | ICD-10-CM | POA: Diagnosis not present

## 2023-01-18 DIAGNOSIS — H26492 Other secondary cataract, left eye: Secondary | ICD-10-CM | POA: Diagnosis not present

## 2023-01-18 DIAGNOSIS — H04129 Dry eye syndrome of unspecified lacrimal gland: Secondary | ICD-10-CM | POA: Diagnosis not present

## 2023-01-18 DIAGNOSIS — H2511 Age-related nuclear cataract, right eye: Secondary | ICD-10-CM | POA: Diagnosis not present

## 2023-01-18 DIAGNOSIS — H5212 Myopia, left eye: Secondary | ICD-10-CM | POA: Diagnosis not present
